# Patient Record
Sex: Female | Born: 2000 | Race: White | Hispanic: No | Marital: Single | Smoking: Current some day smoker
Health system: Southern US, Community
[De-identification: ages and names within clinical notes are randomized; demographics above are authoritative.]

## PROBLEM LIST (undated history)

## (undated) DIAGNOSIS — F32A Depression, unspecified: Secondary | ICD-10-CM

## (undated) DIAGNOSIS — F419 Anxiety disorder, unspecified: Secondary | ICD-10-CM

## (undated) DIAGNOSIS — O24419 Gestational diabetes mellitus in pregnancy, unspecified control: Secondary | ICD-10-CM

## (undated) DIAGNOSIS — F319 Bipolar disorder, unspecified: Secondary | ICD-10-CM

## (undated) DIAGNOSIS — G8929 Other chronic pain: Secondary | ICD-10-CM

## (undated) DIAGNOSIS — M549 Dorsalgia, unspecified: Secondary | ICD-10-CM

## (undated) HISTORY — PX: TYMPANOSTOMY: SHX2586

## (undated) HISTORY — PX: WISDOM TOOTH EXTRACTION: SHX21

## (undated) HISTORY — PX: ADENOIDECTOMY: SUR15

---

## 2018-02-17 ENCOUNTER — Other Ambulatory Visit: Payer: Self-pay

## 2018-02-17 ENCOUNTER — Ambulatory Visit (HOSPITAL_COMMUNITY)
Admission: EM | Admit: 2018-02-17 | Discharge: 2018-02-17 | Disposition: A | Discharge status: Home / Self Care | Payer: BLUE CROSS/BLUE SHIELD | Attending: Urgent Care | Admitting: Urgent Care

## 2018-02-17 ENCOUNTER — Encounter (HOSPITAL_COMMUNITY): Payer: Self-pay | Admitting: Emergency Medicine

## 2018-02-17 DIAGNOSIS — R102 Pelvic and perineal pain: Secondary | ICD-10-CM | POA: Insufficient documentation

## 2018-02-17 DIAGNOSIS — F17210 Nicotine dependence, cigarettes, uncomplicated: Secondary | ICD-10-CM | POA: Insufficient documentation

## 2018-02-17 DIAGNOSIS — E860 Dehydration: Secondary | ICD-10-CM | POA: Insufficient documentation

## 2018-02-17 DIAGNOSIS — K5900 Constipation, unspecified: Secondary | ICD-10-CM | POA: Diagnosis not present

## 2018-02-17 DIAGNOSIS — R1030 Lower abdominal pain, unspecified: Secondary | ICD-10-CM

## 2018-02-17 LAB — POCT URINALYSIS DIP (DEVICE)
Bilirubin Urine: NEGATIVE
GLUCOSE, UA: NEGATIVE mg/dL
Hgb urine dipstick: NEGATIVE
Ketones, ur: NEGATIVE mg/dL
Leukocytes, UA: NEGATIVE
Nitrite: NEGATIVE
PROTEIN: NEGATIVE mg/dL
SPECIFIC GRAVITY, URINE: 1.025 (ref 1.005–1.030)
UROBILINOGEN UA: 0.2 mg/dL (ref 0.0–1.0)
pH: 6 (ref 5.0–8.0)

## 2018-02-17 NOTE — ED Triage Notes (Signed)
Abdominal pain for 2 weeks, intermittent.  Pain has been significant today.  Patient has had nausea.  Last bm was this morning, patient thinks she is constipated.  Patient has headaches and general aches, shaky.

## 2018-02-17 NOTE — ED Provider Notes (Signed)
  MRN: [id number] DOB: [birth year]  Subjective:   Isabel Chang is a 17 y.o. female presenting for 2 week history of lower abdominal/pelvic pain, worse today. Has had nausea without vomiting, urinary frequency (urinating 8-10), aching, general headaches. Has difficulty with constipation, strains, has painful defecation. Has small, hard stools. Denies fever, dysuria, hematuria. Does not hydrate with water. Drinks sodas and practices very unhealthy diet. She is currently smoking cigarettes.  Isabel Chang is not currently taking any medications and has No Known Allergies.  Isabel Chang denies past medical and surgical history.   Objective:   Vitals: BP 108/74 (BP Location: Left Arm)   Pulse 98   Temp 98.1 F (36.7 C) (Oral)   Resp 18   LMP 01/02/2018   SpO2 98%   Physical Exam  Constitutional: She is oriented to person, place, and time. She appears well-developed and well-nourished.  HENT:  Mouth/Throat: Oropharynx is clear and moist.  Eyes: No scleral icterus.  Cardiovascular: Normal rate, regular rhythm and intact distal pulses. Exam reveals no gallop and no friction rub.  No murmur heard. Pulmonary/Chest: No respiratory distress. She has no wheezes. She has no rales.  Abdominal: Soft. Bowel sounds are normal. She exhibits no distension and no mass. There is no tenderness. There is no guarding.  Musculoskeletal: She exhibits no edema.  Neurological: She is alert and oriented to person, place, and time.  Skin: Skin is warm and dry. No rash noted. No erythema. No pallor.  Psychiatric: She has a normal mood and affect.   Results for orders placed or performed during the hospital encounter of 02/17/18 (from the past 24 hour(s))  POCT urinalysis dip (device)     Status: None   Collection Time: 02/17/18  8:08 PM  Result Value Ref Range   Glucose, UA NEGATIVE NEGATIVE mg/dL   Bilirubin Urine NEGATIVE NEGATIVE   Ketones, ur NEGATIVE NEGATIVE mg/dL   Specific Gravity, Urine 1.025 1.005 - 1.030   Hgb  urine dipstick NEGATIVE NEGATIVE   pH 6.0 5.0 - 8.0   Protein, ur NEGATIVE NEGATIVE mg/dL   Urobilinogen, UA 0.2 0.0 - 1.0 mg/dL   Nitrite NEGATIVE NEGATIVE   Leukocytes, UA NEGATIVE NEGATIVE   Assessment and Plan :   Pelvic pain in female  Lower abdominal pain  Constipation, unspecified constipation type  Dehydration  Counseled on differential. I suspect her symptoms are largely due unhealthy lifestyle, recommended lifestyle modifications. Return-to-clinic precautions discussed, patient verbalized understanding.    Wallis BambergMani, Jammie Troup, PA-C 02/18/18 1600

## 2018-02-19 LAB — URINE CULTURE

## 2018-09-04 ENCOUNTER — Encounter: Payer: Self-pay | Admitting: Emergency Medicine

## 2018-09-04 ENCOUNTER — Emergency Department
Admission: EM | Admit: 2018-09-04 | Discharge: 2018-09-04 | Disposition: A | Discharge status: Home / Self Care | Payer: BLUE CROSS/BLUE SHIELD | Source: Home / Self Care | Attending: Family Medicine | Admitting: Family Medicine

## 2018-09-04 ENCOUNTER — Emergency Department (INDEPENDENT_AMBULATORY_CARE_PROVIDER_SITE_OTHER): Payer: BLUE CROSS/BLUE SHIELD

## 2018-09-04 DIAGNOSIS — M545 Low back pain, unspecified: Secondary | ICD-10-CM

## 2018-09-04 DIAGNOSIS — G8929 Other chronic pain: Secondary | ICD-10-CM | POA: Diagnosis not present

## 2018-09-04 DIAGNOSIS — M546 Pain in thoracic spine: Secondary | ICD-10-CM

## 2018-09-04 MED ORDER — CYCLOBENZAPRINE HCL 10 MG PO TABS: 10.0000 mg | ORAL_TABLET | Freq: Every day | ORAL | 1 refills | Status: DC

## 2018-09-04 MED ORDER — PREDNISONE 20 MG PO TABS: ORAL_TABLET | ORAL | 0 refills | Status: DC
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
## 2018-12-02 ENCOUNTER — Encounter: Payer: Self-pay | Admitting: *Deleted

## 2018-12-02 ENCOUNTER — Other Ambulatory Visit: Payer: Self-pay

## 2018-12-02 ENCOUNTER — Emergency Department
Admission: EM | Admit: 2018-12-02 | Discharge: 2018-12-02 | Disposition: A | Discharge status: Home / Self Care | Payer: BLUE CROSS/BLUE SHIELD | Source: Home / Self Care | Attending: Family Medicine | Admitting: Family Medicine

## 2018-12-02 DIAGNOSIS — R110 Nausea: Secondary | ICD-10-CM

## 2018-12-02 DIAGNOSIS — R5383 Other fatigue: Secondary | ICD-10-CM | POA: Diagnosis not present

## 2018-12-02 DIAGNOSIS — M25561 Pain in right knee: Secondary | ICD-10-CM

## 2018-12-02 DIAGNOSIS — M25562 Pain in left knee: Secondary | ICD-10-CM

## 2018-12-02 DIAGNOSIS — M25522 Pain in left elbow: Secondary | ICD-10-CM

## 2018-12-02 DIAGNOSIS — M25521 Pain in right elbow: Secondary | ICD-10-CM

## 2018-12-02 HISTORY — DX: Dorsalgia, unspecified: M54.9

## 2018-12-02 HISTORY — DX: Bipolar disorder, unspecified: F31.9

## 2018-12-02 HISTORY — DX: Other chronic pain: G89.29

## 2018-12-02 LAB — POCT URINALYSIS DIP (MANUAL ENTRY)
BILIRUBIN UA: NEGATIVE mg/dL
Blood, UA: NEGATIVE
GLUCOSE UA: NEGATIVE mg/dL
Leukocytes, UA: NEGATIVE
Nitrite, UA: NEGATIVE
Protein Ur, POC: 30 mg/dL — AB
SPEC GRAV UA: 1.02 (ref 1.010–1.025)
Urobilinogen, UA: 1 E.U./dL
pH, UA: 6.5 (ref 5.0–8.0)

## 2018-12-02 LAB — POCT CBC W AUTO DIFF (K'VILLE URGENT CARE)

## 2018-12-02 LAB — POCT URINE PREGNANCY: PREG TEST UR: NEGATIVE

## 2018-12-02 NOTE — ED Triage Notes (Signed)
Pt c/o N/V, decreased appetite and fluctuating weights x 3 wks.
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
## 2018-12-03 ENCOUNTER — Telehealth: Payer: Self-pay | Admitting: *Deleted

## 2018-12-03 LAB — COMPLETE METABOLIC PANEL WITH GFR
AG Ratio: 1.8 (calc) (ref 1.0–2.5)
ALT: 12 U/L (ref 5–32)
AST: 16 U/L (ref 12–32)
Albumin: 4.8 g/dL (ref 3.6–5.1)
Alkaline phosphatase (APISO): 52 U/L (ref 47–176)
BUN: 9 mg/dL (ref 7–20)
CALCIUM: 10.3 mg/dL (ref 8.9–10.4)
CO2: 25 mmol/L (ref 20–32)
Chloride: 102 mmol/L (ref 98–110)
Creat: 0.82 mg/dL (ref 0.50–1.00)
GFR, Est African American: 120 mL/min/{1.73_m2} (ref >=60)
GFR, Est Non African American: 104 mL/min/{1.73_m2} (ref >=60)
GLOBULIN: 2.7 g/dL (ref 2.0–3.8)
Glucose, Bld: 76 mg/dL (ref 65–99)
Potassium: 4.6 mmol/L (ref 3.8–5.1)
SODIUM: 139 mmol/L (ref 135–146)
Total Bilirubin: 0.7 mg/dL (ref 0.2–1.1)
Total Protein: 7.5 g/dL (ref 6.3–8.2)

## 2018-12-03 LAB — TSH: TSH: 1.33 mIU/L

## 2018-12-03 LAB — SEDIMENTATION RATE: SED RATE: 11 mm/h (ref 0–20)

## 2018-12-03 NOTE — Telephone Encounter (Signed)
Contacted pt, states she feels better this am, advised that her lab work all came back normal and to return as needed.

## 2019-12-19 NOTE — L&D Delivery Note (Signed)
Operative Delivery Note At 10:51 AM a viable female was delivered via Vaginal, Spontaneous.  Presentation: vertex; Position: Right,, Occiput,, Anterior; Station: +3.  With pushing late decels noted with FHR into the 90 with recovery to baseline.  Discussed plan for vacuum due to non-reassuring fetal heart tones to help expedite delivery.   Verbal consent: obtained from patient.  Risks and benefits discussed in detail.  Risks include, but are not limited to the risks of anesthesia, bleeding, infection, damage to maternal tissues, fetal cephalhematoma.  There is also the risk of inability to effect vaginal delivery of the head, or shoulder dystocia that cannot be resolved by established maneuvers, leading to the need for emergency cesarean section.  Foley and IUPC removed with pushing.  Bell applied- one pull, no pop offs with delivery  APGAR:8 , 9; weight pending .   Placenta status: L&D.   Cord:  with the following complications: none.  Cord pH: n/a  Anesthesia:  epidural Instruments: Bell Episiotomy: None Lacerations: Labial Suture Repair: 3.0 vicryl Est. Blood Loss (mL): 355  Mom to postpartum.  Baby to Couplet care / Skin to Skin.  [name] [name] 08/23/2020, 11:12 AM

## 2020-01-26 LAB — OB RESULTS CONSOLE RPR: RPR: NONREACTIVE

## 2020-01-26 LAB — OB RESULTS CONSOLE HEPATITIS B SURFACE ANTIGEN: Hepatitis B Surface Ag: NEGATIVE

## 2020-01-26 LAB — OB RESULTS CONSOLE HIV ANTIBODY (ROUTINE TESTING): HIV: NONREACTIVE

## 2020-01-26 LAB — OB RESULTS CONSOLE RUBELLA ANTIBODY, IGM: Rubella: IMMUNE

## 2020-02-12 LAB — OB RESULTS CONSOLE GC/CHLAMYDIA
Chlamydia: NEGATIVE
Gonorrhea: NEGATIVE

## 2020-04-14 ENCOUNTER — Other Ambulatory Visit (HOSPITAL_COMMUNITY): Payer: Self-pay | Admitting: Obstetrics & Gynecology

## 2020-04-14 DIAGNOSIS — Z363 Encounter for antenatal screening for malformations: Secondary | ICD-10-CM

## 2020-05-04 ENCOUNTER — Ambulatory Visit (HOSPITAL_COMMUNITY): Payer: BC Managed Care – PPO | Attending: Obstetrics and Gynecology

## 2020-05-04 ENCOUNTER — Other Ambulatory Visit: Payer: Self-pay

## 2020-05-04 ENCOUNTER — Ambulatory Visit: Payer: BC Managed Care – PPO | Admitting: *Deleted

## 2020-05-04 VITALS — BP 117/72 | HR 96

## 2020-05-04 DIAGNOSIS — Z3A24 24 weeks gestation of pregnancy: Secondary | ICD-10-CM

## 2020-05-04 DIAGNOSIS — Z363 Encounter for antenatal screening for malformations: Secondary | ICD-10-CM | POA: Insufficient documentation

## 2020-05-04 DIAGNOSIS — O0990 Supervision of high risk pregnancy, unspecified, unspecified trimester: Secondary | ICD-10-CM | POA: Diagnosis present

## 2020-05-04 DIAGNOSIS — O99332 Smoking (tobacco) complicating pregnancy, second trimester: Secondary | ICD-10-CM

## 2020-05-04 DIAGNOSIS — E669 Obesity, unspecified: Secondary | ICD-10-CM | POA: Diagnosis not present

## 2020-06-16 ENCOUNTER — Encounter: Payer: BC Managed Care – PPO | Attending: Obstetrics & Gynecology | Admitting: Registered"

## 2020-06-16 ENCOUNTER — Encounter (HOSPITAL_COMMUNITY): Payer: Self-pay | Admitting: Obstetrics and Gynecology

## 2020-06-16 ENCOUNTER — Inpatient Hospital Stay (HOSPITAL_COMMUNITY)
Admission: AD | Admit: 2020-06-16 | Discharge: 2020-06-16 | Disposition: A | Discharge status: Home / Self Care | Payer: BC Managed Care – PPO | Attending: Obstetrics & Gynecology | Admitting: Obstetrics & Gynecology

## 2020-06-16 ENCOUNTER — Other Ambulatory Visit: Payer: Self-pay

## 2020-06-16 DIAGNOSIS — R109 Unspecified abdominal pain: Secondary | ICD-10-CM | POA: Insufficient documentation

## 2020-06-16 DIAGNOSIS — O26899 Other specified pregnancy related conditions, unspecified trimester: Secondary | ICD-10-CM

## 2020-06-16 DIAGNOSIS — Z79899 Other long term (current) drug therapy: Secondary | ICD-10-CM | POA: Diagnosis not present

## 2020-06-16 DIAGNOSIS — F319 Bipolar disorder, unspecified: Secondary | ICD-10-CM | POA: Diagnosis not present

## 2020-06-16 DIAGNOSIS — K5900 Constipation, unspecified: Secondary | ICD-10-CM | POA: Diagnosis not present

## 2020-06-16 DIAGNOSIS — Z3A30 30 weeks gestation of pregnancy: Secondary | ICD-10-CM | POA: Insufficient documentation

## 2020-06-16 DIAGNOSIS — O99613 Diseases of the digestive system complicating pregnancy, third trimester: Secondary | ICD-10-CM | POA: Insufficient documentation

## 2020-06-16 DIAGNOSIS — O99333 Smoking (tobacco) complicating pregnancy, third trimester: Secondary | ICD-10-CM | POA: Diagnosis not present

## 2020-06-16 DIAGNOSIS — F17210 Nicotine dependence, cigarettes, uncomplicated: Secondary | ICD-10-CM | POA: Insufficient documentation

## 2020-06-16 DIAGNOSIS — O99343 Other mental disorders complicating pregnancy, third trimester: Secondary | ICD-10-CM | POA: Insufficient documentation

## 2020-06-16 DIAGNOSIS — O26893 Other specified pregnancy related conditions, third trimester: Secondary | ICD-10-CM | POA: Diagnosis not present

## 2020-06-16 HISTORY — DX: Anxiety disorder, unspecified: F41.9

## 2020-06-16 HISTORY — DX: Gestational diabetes mellitus in pregnancy, unspecified control: O24.419

## 2020-06-16 HISTORY — DX: Depression, unspecified: F32.A

## 2020-06-16 LAB — URINALYSIS, ROUTINE W REFLEX MICROSCOPIC
Bilirubin Urine: NEGATIVE
Glucose, UA: NEGATIVE mg/dL
Hgb urine dipstick: NEGATIVE
Ketones, ur: NEGATIVE mg/dL
Leukocytes,Ua: NEGATIVE
Nitrite: NEGATIVE
Protein, ur: NEGATIVE mg/dL
Specific Gravity, Urine: 1.013 (ref 1.005–1.030)
pH: 8 (ref 5.0–8.0)

## 2020-06-16 LAB — WET PREP, GENITAL
Clue Cells Wet Prep HPF POC: NONE SEEN
Sperm: NONE SEEN
Trich, Wet Prep: NONE SEEN
WBC, Wet Prep HPF POC: NONE SEEN
Yeast Wet Prep HPF POC: NONE SEEN

## 2020-06-16 NOTE — Discharge Instructions (Signed)
Fetal Movement Counts Patient Name: ________________________________________________ Patient Due Date: ____________________ What is a fetal movement count?  A fetal movement count is the number of times that you feel your baby move during a certain amount of time. This may also be called a fetal kick count. A fetal movement count is recommended for every pregnant woman. You may be asked to start counting fetal movements as early as week 28 of your pregnancy. Pay attention to when your baby is most active. You may notice your baby's sleep and wake cycles. You may also notice things that make your baby move more. You should do a fetal movement count:  When your baby is normally most active.  At the same time each day. A good time to count movements is while you are resting, after having something to eat and drink. How do I count fetal movements? 1. Find a quiet, comfortable area. Sit, or lie down on your side. 2. Write down the date, the start time and stop time, and the number of movements that you felt between those two times. Take this information with you to your health care visits. 3. Write down your start time when you feel the first movement. 4. Count kicks, flutters, swishes, rolls, and jabs. You should feel at least 10 movements. 5. You may stop counting after you have felt 10 movements, or if you have been counting for 2 hours. Write down the stop time. 6. If you do not feel 10 movements in 2 hours, contact your health care provider for further instructions. Your health care provider may want to do additional tests to assess your baby's well-being. Contact a health care provider if:  You feel fewer than 10 movements in 2 hours.  Your baby is not moving like he or she usually does. Date: ____________ Start time: ____________ Stop time: ____________ Movements: ____________ Date: ____________ Start time: ____________ Stop time: ____________ Movements: ____________ Date: ____________  Start time: ____________ Stop time: ____________ Movements: ____________ Date: ____________ Start time: ____________ Stop time: ____________ Movements: ____________ Date: ____________ Start time: ____________ Stop time: ____________ Movements: ____________ Date: ____________ Start time: ____________ Stop time: ____________ Movements: ____________ Date: ____________ Start time: ____________ Stop time: ____________ Movements: ____________ Date: ____________ Start time: ____________ Stop time: ____________ Movements: ____________ Date: ____________ Start time: ____________ Stop time: ____________ Movements: ____________ This information is not intended to replace advice given to you by your health care provider. Make sure you discuss any questions you have with your health care provider. Document Revised: 07/24/2019 Document Reviewed: 07/24/2019 Elsevier Patient Education  2020 [employer]. Constipation, Adult Constipation is when a person has fewer bowel movements in a week than normal, has difficulty having a bowel movement, or has stools that are dry, hard, or larger than normal. Constipation may be caused by an underlying condition. It may become worse with age if a person takes certain medicines and does not take in enough fluids. Follow these instructions at home: Eating and drinking   Eat foods that have a lot of fiber, such as fresh fruits and vegetables, whole grains, and beans.  Limit foods that are high in fat, low in fiber, or overly processed, such as french fries, hamburgers, cookies, candies, and soda.  Drink enough fluid to keep your urine clear or pale yellow. General instructions  Exercise regularly or as told by your health care provider.  Go to the restroom when you have the urge to go. Do not hold it in.  Take over-the-counter and prescription  medicines only as told by your health care provider. These include any fiber supplements.  Practice pelvic floor retraining  exercises, such as deep breathing while relaxing the lower abdomen and pelvic floor relaxation during bowel movements.  Watch your condition for any changes.  Keep all follow-up visits as told by your health care provider. This is important. Contact a health care provider if:  You have pain that gets worse.  You have a fever.  You do not have a bowel movement after 4 days.  You vomit.  You are not hungry.  You lose weight.  You are bleeding from the anus.  You have thin, pencil-like stools. Get help right away if:  You have a fever and your symptoms suddenly get worse.  You leak stool or have blood in your stool.  Your abdomen is bloated.  You have severe pain in your abdomen.  You feel dizzy or you faint. This information is not intended to replace advice given to you by your health care provider. Make sure you discuss any questions you have with your health care provider. Document Revised: 11/16/2017 Document Reviewed: 05/24/2016 Elsevier Patient Education  2020 [employer].

## 2020-06-16 NOTE — MAU Provider Note (Signed)
Chief Complaint:  Abdominal Pain   First Provider Initiated Contact with Patient 06/16/20 1608     HPI: Isabel Chang is a 19 y.o. G1P0 at 30w2dwho presents to maternity admissions reporting abdominal cramping. Symptoms started this morning. States they are consistent and have worsened through the day. Has not noticed an increase in yellow discharge since yesterday that is causing some vaginal irritation. Has been constipated; last bowel movement was 2 days ago, has not treated constipation. Endorses nausea, has not treated symptoms, thinks it is due to not eating recently. She has not drank much water today. No vomiting. Denies fever, dysuria, vaginal bleeding, or loss of fluid. No recent intercourse. Good fetal movement.  Location: abdomen Quality: cramping Severity: 4/10 in pain scale Duration: 1 day Timing: intermittent Modifying factors: none Associated signs and symptoms: nausea, vaginal discharge  Pregnancy Course: [facility] Denies complications with this pregnancy  Past Medical History:  Diagnosis Date  . Anxiety   . Bipolar disorder ([place]   . Chronic back pain   . Depression   . Gestational diabetes    OB History  Gravida Para Term Preterm AB Living  1            SAB TAB Ectopic Multiple Live Births               # Outcome Date GA Lbr Len/2nd Weight Sex Delivery Anes PTL Lv  1 Current            Past Surgical History:  Procedure Laterality Date  . ADENOIDECTOMY    . TYMPANOSTOMY    . WISDOM TOOTH EXTRACTION     Family History  Problem Relation Age of Onset  . Diabetes Mother    Social History   Tobacco Use  . Smoking status: Current Some Day Smoker    Packs/day: 1.00    Types: Cigarettes  . Smokeless tobacco: Never Used  Vaping Use  . Vaping Use: Every day  Substance Use Topics  . Alcohol use: Not Currently  . Drug use: Yes    Types: Marijuana    Comment: May was last usage   No Known Allergies Medications Prior to Admission   Medication Sig Dispense Refill Last Dose  . Blood Glucose Monitoring Suppl (ACCU-CHEK GUIDE) w/Device KIT AS DIRECTED FINGER STICK     . CONTOUR NEXT TEST test strip as directed.     . ergocalciferol (VITAMIN D2) 1.25 MG (50000 UT) capsule ergocalciferol (vitamin D2) 1,250 mcg (50,000 unit) capsule  TK 1 C PO Q WK IN THE MORNING     . fluticasone (FLONASE) 50 MCG/ACT nasal spray fluticasone propionate 50 mcg/actuation nasal spray,suspension  SHAKE LQ AND U 1 SPR NASALLY QD IN THE MORNING     . lamoTRIgine (LAMICTAL) 25 MG tablet      . loratadine (CLARITIN) 10 MG tablet loratadine 10 mg tablet  TK 1 T PO QD IN THE MORNING     . Microlet Lancets MISC 4 (four) times daily.     . ondansetron (ZOFRAN-ODT) 4 MG disintegrating tablet ondansetron 4 mg disintegrating tablet     . Prenatal Vit-Fe Fumarate-FA (PRENATAL VITAMINS PO) Take by mouth.       I have reviewed patient's Past Medical Hx, Surgical Hx, Family Hx, Social Hx, medications and allergies.   ROS:  Review of Systems  Constitutional: Negative.   Gastrointestinal: Positive for abdominal pain, constipation and nausea. Negative for diarrhea and vomiting.  Genitourinary: Positive for vaginal discharge.  Negative for dysuria and vaginal bleeding.    Physical Exam   Patient Vitals for the past 24 hrs:  BP Temp Temp src Pulse Resp SpO2 Height Weight  06/16/20 1548 129/67 98.4 F (36.9 C) Oral 91 18 100 % 5' 2"  (1.575 m) 81.2 kg    Constitutional: Well-developed, well-nourished female in no acute distress.  Cardiovascular: normal rate & rhythm, no murmur Respiratory: normal effort, lung sounds clear throughout GI: Abd soft, non-tender, gravid appropriate for gestational age. Pos BS x 4 MS: Extremities nontender, no edema, normal ROM Neurologic: Alert and oriented x 4.  GU:      Pelvic: NEFG, physiologic discharge, no blood, cervix clean.   Dilation: Closed Effacement (%): Thick Cervical Position: Posterior Exam by:: Jorje Guild NP  Fetal Tracing:  Baseline: 145 Variability: moderate Accelerations: 15x15 Decelerations: none  Toco: none    Labs: Results for orders placed or performed during the hospital encounter of 06/16/20 (from the past 24 hour(s))  Urinalysis, Routine w reflex microscopic     Status: Abnormal   Collection Time: 06/16/20  3:34 PM  Result Value Ref Range   Color, Urine YELLOW YELLOW   APPearance HAZY (A) CLEAR   Specific Gravity, Urine 1.013 1.005 - 1.030   pH 8.0 5.0 - 8.0   Glucose, UA NEGATIVE NEGATIVE mg/dL   Hgb urine dipstick NEGATIVE NEGATIVE   Bilirubin Urine NEGATIVE NEGATIVE   Ketones, ur NEGATIVE NEGATIVE mg/dL   Protein, ur NEGATIVE NEGATIVE mg/dL   Nitrite NEGATIVE NEGATIVE   Leukocytes,Ua NEGATIVE NEGATIVE  Wet prep, genital     Status: None   Collection Time: 06/16/20  4:44 PM  Result Value Ref Range   Yeast Wet Prep HPF POC NONE SEEN NONE SEEN   Trich, Wet Prep NONE SEEN NONE SEEN   Clue Cells Wet Prep HPF POC NONE SEEN NONE SEEN   WBC, Wet Prep HPF POC NONE SEEN NONE SEEN   Sperm NONE SEEN     Imaging:  No results found.  MAU Course: Orders Placed This Encounter  Procedures  . Wet prep, genital  . Urinalysis, Routine w reflex microscopic  . Discharge patient   No orders of the defined types were placed in this encounter.   MDM: Reactive nst. No contractions on monitor. Abdomen soft & non tender. Cervix closed/thick Negative u/a Negative wet prep  Assessment: 1. Abdominal cramping affecting pregnancy   2. [redacted] weeks gestation of pregnancy     Plan: Discharge home in stable condition.  GC/CT pending Discussed reasons to return to MAU Keep appointment with Dr. Nelda Marseille tomorrow     Follow-up Information    Gynecology, [facility].   Specialty: Obstetrics and Gynecology Why: keep appointment tomorrow Contact information: [address] [place] [address] [phone]               Allergies  as of 06/16/2020   No Known Allergies     Medication List    TAKE these medications   Accu-Chek Guide w/Device Kit AS DIRECTED FINGER STICK   Contour Next Test test strip Generic drug: glucose blood as directed.   ergocalciferol 1.25 MG (50000 UT) capsule Commonly known as: VITAMIN D2 ergocalciferol (vitamin D2) 1,250 mcg (50,000 unit) capsule  TK 1 C PO Q WK IN THE MORNING   fluticasone 50 MCG/ACT nasal spray Commonly known as: FLONASE fluticasone propionate 50 mcg/actuation nasal spray,suspension  SHAKE LQ AND U 1 SPR NASALLY QD IN THE MORNING  lamoTRIgine 25 MG tablet Commonly known as: LAMICTAL   loratadine 10 MG tablet Commonly known as: CLARITIN loratadine 10 mg tablet  TK 1 T PO QD IN THE MORNING   Microlet Lancets Misc 4 (four) times daily.   ondansetron 4 MG disintegrating tablet Commonly known as: ZOFRAN-ODT ondansetron 4 mg disintegrating tablet   PRENATAL VITAMINS PO Take by mouth.       Jorje Guild, NP 06/16/2020 5:11 PM

## 2020-06-16 NOTE — MAU Note (Signed)
Patient c/o cramping since this morning at 1030. No bleeding. Fetal movement per patient. Nausea and not feeling "well".

## 2020-06-17 LAB — GC/CHLAMYDIA PROBE AMP (~~LOC~~) NOT AT ARMC
Chlamydia: NEGATIVE
Comment: NEGATIVE
Comment: NORMAL
Neisseria Gonorrhea: NEGATIVE

## 2020-07-08 ENCOUNTER — Other Ambulatory Visit: Payer: Self-pay

## 2020-07-08 ENCOUNTER — Encounter (HOSPITAL_COMMUNITY): Payer: Self-pay | Admitting: Obstetrics & Gynecology

## 2020-07-08 ENCOUNTER — Inpatient Hospital Stay (HOSPITAL_COMMUNITY)
Admission: AD | Admit: 2020-07-08 | Discharge: 2020-07-08 | Disposition: A | Discharge status: Home / Self Care | Payer: BC Managed Care – PPO | Attending: Obstetrics & Gynecology | Admitting: Obstetrics & Gynecology

## 2020-07-08 DIAGNOSIS — N859 Noninflammatory disorder of uterus, unspecified: Secondary | ICD-10-CM | POA: Diagnosis not present

## 2020-07-08 DIAGNOSIS — F1290 Cannabis use, unspecified, uncomplicated: Secondary | ICD-10-CM | POA: Diagnosis not present

## 2020-07-08 DIAGNOSIS — N858 Other specified noninflammatory disorders of uterus: Secondary | ICD-10-CM

## 2020-07-08 DIAGNOSIS — O99333 Smoking (tobacco) complicating pregnancy, third trimester: Secondary | ICD-10-CM | POA: Diagnosis not present

## 2020-07-08 DIAGNOSIS — F17210 Nicotine dependence, cigarettes, uncomplicated: Secondary | ICD-10-CM | POA: Diagnosis not present

## 2020-07-08 DIAGNOSIS — M545 Low back pain: Secondary | ICD-10-CM

## 2020-07-08 DIAGNOSIS — Z3A33 33 weeks gestation of pregnancy: Secondary | ICD-10-CM | POA: Diagnosis not present

## 2020-07-08 DIAGNOSIS — R109 Unspecified abdominal pain: Secondary | ICD-10-CM

## 2020-07-08 DIAGNOSIS — O4700 False labor before 37 completed weeks of gestation, unspecified trimester: Secondary | ICD-10-CM

## 2020-07-08 DIAGNOSIS — O26893 Other specified pregnancy related conditions, third trimester: Secondary | ICD-10-CM | POA: Diagnosis present

## 2020-07-08 DIAGNOSIS — O99323 Drug use complicating pregnancy, third trimester: Secondary | ICD-10-CM | POA: Diagnosis not present

## 2020-07-08 DIAGNOSIS — O4703 False labor before 37 completed weeks of gestation, third trimester: Secondary | ICD-10-CM | POA: Diagnosis not present

## 2020-07-08 DIAGNOSIS — Z79899 Other long term (current) drug therapy: Secondary | ICD-10-CM | POA: Insufficient documentation

## 2020-07-08 LAB — URINALYSIS, ROUTINE W REFLEX MICROSCOPIC
Bilirubin Urine: NEGATIVE
Glucose, UA: NEGATIVE mg/dL
Hgb urine dipstick: NEGATIVE
Ketones, ur: NEGATIVE mg/dL
Leukocytes,Ua: NEGATIVE
Nitrite: NEGATIVE
Protein, ur: NEGATIVE mg/dL
Specific Gravity, Urine: 1.009 (ref 1.005–1.030)
pH: 7 (ref 5.0–8.0)

## 2020-07-08 LAB — FETAL FIBRONECTIN: Fetal Fibronectin: NEGATIVE

## 2020-07-08 MED ORDER — NIFEDIPINE 10 MG PO CAPS
10.0000 mg | ORAL_CAPSULE | ORAL | Status: AC | PRN
  Administered 2020-07-08 (×4): 10 mg via ORAL
  Filled 2020-07-08 (×4): qty 1

## 2020-07-08 NOTE — Progress Notes (Signed)
Pt up to BR

## 2020-07-08 NOTE — MAU Note (Signed)
Have cramping and lower back pain for 24hrs. Denies LOF or VB

## 2020-07-08 NOTE — MAU Provider Note (Addendum)
Chief Complaint:  Abdominal Pain, Contractions, and Back Pain   First Provider Initiated Contact with Patient 07/08/20 2008     HPI: Isabel Chang is a 19 y.o. G1P0 at 61w3dho presents to maternity admissions reporting generalized abdominal cramping and low back pain for the past day or so.  No history of PTL . She reports good fetal movement, denies LOF, vaginal bleeding, vaginal itching/burning, urinary symptoms, h/a, dizziness, n/v, diarrhea, constipation or fever/chills.    Abdominal Pain This is a new problem. The current episode started yesterday. The onset quality is gradual. The problem occurs intermittently. The problem has been unchanged. The pain is located in the generalized abdominal region. The quality of the pain is cramping. The abdominal pain radiates to the back. Pertinent negatives include no constipation, diarrhea, dysuria, fever, frequency, nausea or vomiting. Nothing aggravates the pain. The pain is relieved by nothing. She has tried nothing for the symptoms.  Back Pain This is a new problem. The current episode started yesterday. The problem occurs intermittently. The problem is unchanged. The pain is present in the lumbar spine. The quality of the pain is described as cramping. The pain does not radiate. Associated symptoms include abdominal pain. Pertinent negatives include no dysuria, fever or paresis. She has tried nothing for the symptoms.    RN Note: Have cramping and lower back pain for 24hrs. Denies LOF or VB  Past Medical History: Past Medical History:  Diagnosis Date  . Anxiety   . Bipolar disorder ([place]   . Chronic back pain   . Depression   . Gestational diabetes     Past obstetric history: OB History  Gravida Para Term Preterm AB Living  1            SAB TAB Ectopic Multiple Live Births               # Outcome Date GA Lbr Len/2nd Weight Sex Delivery Anes PTL Lv  1 Current             Past Surgical History: Past Surgical History:  Procedure  Laterality Date  . ADENOIDECTOMY    . TYMPANOSTOMY    . WISDOM TOOTH EXTRACTION      Family History: Family History  Problem Relation Age of Onset  . Diabetes Mother     Social History: Social History   Tobacco Use  . Smoking status: Current Some Day Smoker    Packs/day: 1.00    Types: Cigarettes  . Smokeless tobacco: Never Used  Vaping Use  . Vaping Use: Every day  Substance Use Topics  . Alcohol use: Not Currently  . Drug use: Yes    Types: Marijuana    Comment: May was last usage    Allergies: No Known Allergies  Meds:  Medications Prior to Admission  Medication Sig Dispense Refill Last Dose  . Blood Glucose Monitoring Suppl (ACCU-CHEK GUIDE) w/Device KIT AS DIRECTED FINGER STICK     . CONTOUR NEXT TEST test strip as directed.     . ergocalciferol (VITAMIN D2) 1.25 MG (50000 UT) capsule ergocalciferol (vitamin D2) 1,250 mcg (50,000 unit) capsule  TK 1 C PO Q WK IN THE MORNING     . fluticasone (FLONASE) 50 MCG/ACT nasal spray fluticasone propionate 50 mcg/actuation nasal spray,suspension  SHAKE LQ AND U 1 SPR NASALLY QD IN THE MORNING     . lamoTRIgine (LAMICTAL) 25 MG tablet      . loratadine (CLARITIN) 10 MG tablet loratadine 10 mg tablet  TK  1 T PO QD IN THE MORNING     . Microlet Lancets MISC 4 (four) times daily.     . ondansetron (ZOFRAN-ODT) 4 MG disintegrating tablet ondansetron 4 mg disintegrating tablet     . Prenatal Vit-Fe Fumarate-FA (PRENATAL VITAMINS PO) Take by mouth.       I have reviewed patient's Past Medical Hx, Surgical Hx, Family Hx, Social Hx, medications and allergies.   ROS:  Review of Systems  Constitutional: Negative for fever.  Gastrointestinal: Positive for abdominal pain. Negative for constipation, diarrhea, nausea and vomiting.  Genitourinary: Negative for dysuria and frequency.  Musculoskeletal: Positive for back pain.   Other systems negative  Physical Exam   Patient Vitals for the past 24 hrs:  BP Temp Pulse Resp  Height Weight  07/08/20 1948 (!) 127/52 -- 90 -- -- --  07/08/20 1947 -- 98.6 F (37 C) -- 18 5' 2"  (1.575 m) 83.5 kg   Constitutional: Well-developed, well-nourished female in no acute distress.  Cardiovascular: normal rate and rhythm Respiratory: normal effort, clear to auscultation bilaterally GI: Abd soft, non-tender, gravid appropriate for gestational age.   No rebound or guarding. MS: Extremities nontender, no edema, normal ROM Neurologic: Alert and oriented x 4.  GU: Neg CVAT.  PELVIC EXAM:   Dilation: Closed Effacement (%): 50 Station: Ballotable Exam by:: Hansel Feinstein CNM  FHT:  Baseline 140 , moderate variability, accelerations present, no decelerations Contractions: Uterine irritability with Irregular contractions   Labs: Results for orders placed or performed during the hospital encounter of 07/08/20 (from the past 24 hour(s))  Urinalysis, Routine w reflex microscopic     Status: Abnormal   Collection Time: 07/08/20  7:56 PM  Result Value Ref Range   Color, Urine YELLOW YELLOW   APPearance HAZY (A) CLEAR   Specific Gravity, Urine 1.009 1.005 - 1.030   pH 7.0 5.0 - 8.0   Glucose, UA NEGATIVE NEGATIVE mg/dL   Hgb urine dipstick NEGATIVE NEGATIVE   Bilirubin Urine NEGATIVE NEGATIVE   Ketones, ur NEGATIVE NEGATIVE mg/dL   Protein, ur NEGATIVE NEGATIVE mg/dL   Nitrite NEGATIVE NEGATIVE   Leukocytes,Ua NEGATIVE NEGATIVE  Fetal fibronectin     Status: None   Collection Time: 07/08/20  8:29 PM  Result Value Ref Range   Fetal Fibronectin NEGATIVE NEGATIVE     Imaging:  No results found.  MAU Course/MDM: I have ordered labs and reviewed results. UA is clear.  Fetal fibronectin is Negative NST reviewed, reactive Updated Dr Nelda Marseille who called to check on patient, with presentation, exam findings and test results.  Treatments in MAU included Procardia series.x 4 doses Her contractions diminished and though her irritability  Persisted, she stated that it was no longer  painful   Since her FFn was negative, will discharge home with close followup in office   Assessment: Single IUP at 62w3dPreterm uterine irritability with some effacement but negative FFn   Plan: Discharge home Preterm Labor precautions and fetal kick counts Follow up in Office for prenatal visits and recheck of cervix  Encouraged to return here or to other Urgent Care/ED if she develops worsening of symptoms, increase in pain, fever, or other concerning symptoms.   Pt stable at time of discharge.  MHansel FeinsteinCNM, MSN Certified Nurse-Midwife 07/08/2020 8:08 PM

## 2020-07-08 NOTE — Progress Notes (Signed)
Written and verbal d/c instructions given and understanding voiced. To f/u with Dr Charlotta Newton next wk

## 2020-07-08 NOTE — Progress Notes (Signed)
OK to d/c transducer for FHR per Artelia Laroche CNM. Will cont toco

## 2020-07-08 NOTE — Discharge Instructions (Signed)

## 2020-07-11 ENCOUNTER — Other Ambulatory Visit: Payer: Self-pay

## 2020-07-11 ENCOUNTER — Encounter (HOSPITAL_COMMUNITY): Payer: Self-pay | Admitting: Obstetrics & Gynecology

## 2020-07-11 ENCOUNTER — Inpatient Hospital Stay (HOSPITAL_COMMUNITY)
Admission: AD | Admit: 2020-07-11 | Discharge: 2020-07-11 | Disposition: A | Discharge status: Home / Self Care | Payer: BC Managed Care – PPO | Attending: Obstetrics & Gynecology | Admitting: Obstetrics & Gynecology

## 2020-07-11 DIAGNOSIS — F17210 Nicotine dependence, cigarettes, uncomplicated: Secondary | ICD-10-CM | POA: Insufficient documentation

## 2020-07-11 DIAGNOSIS — Z0371 Encounter for suspected problem with amniotic cavity and membrane ruled out: Secondary | ICD-10-CM

## 2020-07-11 DIAGNOSIS — F319 Bipolar disorder, unspecified: Secondary | ICD-10-CM | POA: Insufficient documentation

## 2020-07-11 DIAGNOSIS — Z3A33 33 weeks gestation of pregnancy: Secondary | ICD-10-CM

## 2020-07-11 DIAGNOSIS — Z79899 Other long term (current) drug therapy: Secondary | ICD-10-CM | POA: Diagnosis not present

## 2020-07-11 DIAGNOSIS — O99333 Smoking (tobacco) complicating pregnancy, third trimester: Secondary | ICD-10-CM | POA: Diagnosis not present

## 2020-07-11 DIAGNOSIS — O99343 Other mental disorders complicating pregnancy, third trimester: Secondary | ICD-10-CM | POA: Diagnosis not present

## 2020-07-11 DIAGNOSIS — Z3689 Encounter for other specified antenatal screening: Secondary | ICD-10-CM

## 2020-07-11 LAB — POCT FERN TEST: POCT Fern Test: NEGATIVE

## 2020-07-11 NOTE — MAU Provider Note (Signed)
Chief Complaint:  Rupture of Membranes   First Provider Initiated Contact with Patient 07/11/20 2058     HPI: Isabel Chang is a 19 y.o. G1P0 at 60w6dwho presents to maternity admissions reporting possible leaking of fluid. Reports a gush of thin discharge at 630 pm. Feels like something has continued to come out. No odor and appears clear. No regular contractions or vaginal bleeding. Good fetal movement. No recent intercourse. Has appointment with Dr. ONelda Marseillethis Thursday.    Pregnancy Course[facility]  Past Medical History:  Diagnosis Date  . Anxiety   . Bipolar disorder ([place]   . Chronic back pain   . Depression   . Gestational diabetes    OB History  Gravida Para Term Preterm AB Living  1            SAB TAB Ectopic Multiple Live Births               # Outcome Date GA Lbr Len/2nd Weight Sex Delivery Anes PTL Lv  1 Current            Past Surgical History:  Procedure Laterality Date  . ADENOIDECTOMY    . TYMPANOSTOMY    . WISDOM TOOTH EXTRACTION     Family History  Problem Relation Age of Onset  . Diabetes Mother    Social History   Tobacco Use  . Smoking status: Current Some Day Smoker    Packs/day: 0.25    Types: Cigarettes  . Smokeless tobacco: Never Used  Vaping Use  . Vaping Use: Every day  Substance Use Topics  . Alcohol use: Not Currently  . Drug use: Not Currently    Types: Marijuana    Comment: May was last usage   No Known Allergies Medications Prior to Admission  Medication Sig Dispense Refill Last Dose  . Blood Glucose Monitoring Suppl (ACCU-CHEK GUIDE) w/Device KIT AS DIRECTED FINGER STICK   07/11/2020 at Unknown time  . ferrous sulfate 325 (65 FE) MG EC tablet Take 325 mg by mouth 3 (three) times daily with meals.   07/11/2020 at Unknown time  . Prenatal Vit-Fe Fumarate-FA (PRENATAL VITAMINS PO) Take by mouth.   07/11/2020 at Unknown time  . CONTOUR NEXT TEST test strip as directed.     . ergocalciferol (VITAMIN  D2) 1.25 MG (50000 UT) capsule ergocalciferol (vitamin D2) 1,250 mcg (50,000 unit) capsule  TK 1 C PO Q WK IN THE MORNING    at not taking  . fluticasone (FLONASE) 50 MCG/ACT nasal spray fluticasone propionate 50 mcg/actuation nasal spray,suspension  SHAKE LQ AND U 1 SPR NASALLY QD IN THE MORNING    at not taking  . lamoTRIgine (LAMICTAL) 100 MG tablet Take 100 mg by mouth daily.     .Marland Kitchenloratadine (CLARITIN) 10 MG tablet loratadine 10 mg tablet  TK 1 T PO QD IN THE MORNING    at not taking  . Microlet Lancets MISC 4 (four) times daily.     . ondansetron (ZOFRAN-ODT) 4 MG disintegrating tablet ondansetron 4 mg disintegrating tablet    at not taking    I have reviewed patient's Past Medical Hx, Surgical Hx, Family Hx, Social Hx, medications and allergies.   ROS:  Review of Systems  Constitutional: Negative.   Gastrointestinal: Negative.   Genitourinary: Positive for vaginal discharge. Negative for vaginal bleeding.    Physical Exam   Patient Vitals for the past 24 hrs:  BP Temp Pulse Resp  Weight  07/11/20 2021 (!) 119/62 98.6 F (37 C) 85 17 83 kg    Constitutional: Well-developed, well-nourished female in no acute distress.  Cardiovascular: normal rate & rhythm, no murmur Respiratory: normal effort, lung sounds clear throughout GI: Abd soft, non-tender, gravid appropriate for gestational age. Pos BS x 4 MS: Extremities nontender, no edema, normal ROM Neurologic: Alert and oriented x 4.  GU:      Pelvic: NEFG, no blood. No pooling of fluid. Cervix visually closed     Fetal Tracing:  Baseline: 145 Variability: moderate Accelerations: 15x15 Decelerations: none  Toco: irregular UI    Labs: Results for orders placed or performed during the hospital encounter of 07/11/20 (from the past 24 hour(s))  POCT fern test     Status: Normal   Collection Time: 07/11/20  9:10 PM  Result Value Ref Range   POCT Fern Test Negative = intact amniotic membranes     Imaging:  No results  found.  MAU Course: Orders Placed This Encounter  Procedures  . POCT fern test  . Discharge patient   No orders of the defined types were placed in this encounter.   MDM: Reactive NST  Sterile spec exam performed; no pooling of fluid. Negative fern test  Assessment: 1. Encounter for suspected PROM, with rupture of membranes not found   2. [redacted] weeks gestation of pregnancy   3. NST (non-stress test) reactive     Plan: Discharge home in stable condition.  Discussed reasons to return to MAU Keep appt with Dr. Nelda Marseille later this week    Follow-up Information    Gynecology, [place] Follow up.   Specialty: Obstetrics and Gynecology Contact information: [address] [place] [address] [phone]               Allergies as of 07/11/2020   No Known Allergies     Medication List    TAKE these medications   Accu-Chek Guide w/Device Kit AS DIRECTED FINGER STICK   Contour Next Test test strip Generic drug: glucose blood as directed.   ergocalciferol 1.25 MG (50000 UT) capsule Commonly known as: VITAMIN D2 ergocalciferol (vitamin D2) 1,250 mcg (50,000 unit) capsule  TK 1 C PO Q WK IN THE MORNING   ferrous sulfate 325 (65 FE) MG EC tablet Take 325 mg by mouth 3 (three) times daily with meals.   fluticasone 50 MCG/ACT nasal spray Commonly known as: FLONASE fluticasone propionate 50 mcg/actuation nasal spray,suspension  SHAKE LQ AND U 1 SPR NASALLY QD IN THE MORNING   lamoTRIgine 100 MG tablet Commonly known as: LAMICTAL Take 100 mg by mouth daily.   loratadine 10 MG tablet Commonly known as: CLARITIN loratadine 10 mg tablet  TK 1 T PO QD IN THE MORNING   Microlet Lancets Misc 4 (four) times daily.   ondansetron 4 MG disintegrating tablet Commonly known as: ZOFRAN-ODT ondansetron 4 mg disintegrating tablet   PRENATAL VITAMINS PO Take by mouth.       Jorje Guild, NP 07/11/2020 9:17 PM

## 2020-07-11 NOTE — MAU Note (Signed)
Patient reports having a small gush of clear fluid around 1830.  Denies VB.  Some occasional contractions but nothing regular.  Endorses + FM.

## 2020-07-11 NOTE — Discharge Instructions (Signed)
  Return to MAU for regular painful contractions, leaking of watery fluid, vaginal bleeding, or concerns about fetal movement.    Fetal Movement Counts Patient Name: ________________________________________________ Patient Due Date: ____________________ What is a fetal movement count?  A fetal movement count is the number of times that you feel your baby move during a certain amount of time. This may also be called a fetal kick count. A fetal movement count is recommended for every pregnant woman. You may be asked to start counting fetal movements as early as week 28 of your pregnancy. Pay attention to when your baby is most active. You may notice your baby's sleep and wake cycles. You may also notice things that make your baby move more. You should do a fetal movement count:  When your baby is normally most active.  At the same time each day. A good time to count movements is while you are resting, after having something to eat and drink. How do I count fetal movements? 1. Find a quiet, comfortable area. Sit, or lie down on your side. 2. Write down the date, the start time and stop time, and the number of movements that you felt between those two times. Take this information with you to your health care visits. 3. Write down your start time when you feel the first movement. 4. Count kicks, flutters, swishes, rolls, and jabs. You should feel at least 10 movements. 5. You may stop counting after you have felt 10 movements, or if you have been counting for 2 hours. Write down the stop time. 6. If you do not feel 10 movements in 2 hours, contact your health care provider for further instructions. Your health care provider may want to do additional tests to assess your baby's well-being. Contact a health care provider if:  You feel fewer than 10 movements in 2 hours.  Your baby is not moving like he or she usually does. Date: ____________ Start time: ____________ Stop time: ____________  Movements: ____________ Date: ____________ Start time: ____________ Stop time: ____________ Movements: ____________ Date: ____________ Start time: ____________ Stop time: ____________ Movements: ____________ Date: ____________ Start time: ____________ Stop time: ____________ Movements: ____________ Date: ____________ Start time: ____________ Stop time: ____________ Movements: ____________ Date: ____________ Start time: ____________ Stop time: ____________ Movements: ____________ Date: ____________ Start time: ____________ Stop time: ____________ Movements: ____________ Date: ____________ Start time: ____________ Stop time: ____________ Movements: ____________ Date: ____________ Start time: ____________ Stop time: ____________ Movements: ____________ This information is not intended to replace advice given to you by your health care provider. Make sure you discuss any questions you have with your health care provider. Document Revised: 07/24/2019 Document Reviewed: 07/24/2019 Elsevier Patient Education  2020 [employer].

## 2020-08-16 ENCOUNTER — Telehealth (HOSPITAL_COMMUNITY): Payer: Self-pay | Admitting: *Deleted

## 2020-08-16 ENCOUNTER — Encounter (HOSPITAL_COMMUNITY): Payer: Self-pay | Admitting: *Deleted

## 2020-08-16 NOTE — Telephone Encounter (Signed)
Preadmission screen  

## 2020-08-19 ENCOUNTER — Other Ambulatory Visit (HOSPITAL_COMMUNITY)
Admission: RE | Admit: 2020-08-19 | Discharge: 2020-08-19 | Disposition: A | Discharge status: Home / Self Care | Payer: BC Managed Care – PPO | Source: Ambulatory Visit | Attending: Obstetrics & Gynecology | Admitting: Obstetrics & Gynecology

## 2020-08-19 DIAGNOSIS — Z20822 Contact with and (suspected) exposure to covid-19: Secondary | ICD-10-CM | POA: Insufficient documentation

## 2020-08-19 DIAGNOSIS — Z01818 Encounter for other preprocedural examination: Secondary | ICD-10-CM | POA: Insufficient documentation

## 2020-08-19 LAB — SARS CORONAVIRUS 2 (TAT 6-24 HRS): SARS Coronavirus 2: NEGATIVE

## 2020-08-20 NOTE — H&P (Signed)
HPI: 19 y/o G1P0 @ [redacted]w[redacted]d estimated gestational age (as dated by LMP c/w 20 week ultrasound) presents for IOL GDMA1.   no Leaking of Fluid,   no Vaginal Bleeding,   no Uterine Contractions,  + Fetal Movement.  Prenatal care has been provided by Dr. Charlotta Newton  ROS: no HA, no epigastric pain, no visual changes.    Pregnancy complicated by: 1) GDMA1- diet controlled -Last [place] @ 36w-vertex/post, EFW 6#1oz (40%)  2) h/o bipoloar d/o -currently on Lamotrigine 100mg  daily -followed by psychiatry  3) Anemia- iron daily   Prenatal Transfer Tool  Maternal Diabetes: Yes:  Diabetes Type:  Diet controlled Genetic Screening: Normal Maternal Ultrasounds/Referrals: Normal Fetal Ultrasounds or other Referrals:  None Maternal Substance Abuse:  No Significant Maternal Medications:  Meds include: Other:  Lamotrigine Significant Maternal Lab Results: Group B Strep negative   PNL:  GBS negative, Rub Immune, Hep B neg, RPR NR, HIV neg, GC/C neg, glucola:176, abnormal 3hr Hgb: 10.9 Blood type: O positive, antibody neg  Immunizations: Tdap: 06/17/2020  OBHx: primip PMHx:  Bipolar d/o Meds:  PNV, Lamotrigine Allergy:  No Known Allergies SurgHx: none SocHx:   no Tobacco, no  EtOH, no Illicit Drugs  O: LMP 11/17/2019 (Approximate)  Gen. AAOx3, NAD CV.  RRR  No murmur.  Resp. CTAB, no wheeze or crackles. Abd. Gravid,  no tenderness,  no rigidity,  no guarding Extr.  no edema B/L , no calf tenderness, neg Homan's B/L FHT: 140 by doppler in office SVE: FT/soft/-3, vertex- in office   Labs: see orders  A/P:  19 y.o. G1P0 @ [redacted]w[redacted]d EGA who presents for IOL due to GDMA1 -FWB:  Reassuring by doppler -Labor: plan for IOL with cytotec -GBS: negative -Pain management: IV or epidural upon request -h/o bipolar/mood disorder- continue home medication  [redacted]w[redacted]d, DO [phone] (cell) [phone] (office)

## 2020-08-21 ENCOUNTER — Inpatient Hospital Stay (HOSPITAL_COMMUNITY): Payer: BC Managed Care – PPO

## 2020-08-21 ENCOUNTER — Encounter (HOSPITAL_COMMUNITY): Payer: Self-pay | Admitting: Obstetrics & Gynecology

## 2020-08-21 ENCOUNTER — Other Ambulatory Visit: Payer: Self-pay

## 2020-08-21 ENCOUNTER — Inpatient Hospital Stay (HOSPITAL_COMMUNITY)
Admission: AD | Admit: 2020-08-21 | Discharge: 2020-08-25 | DRG: 807 | Disposition: A | Discharge status: Home / Self Care | Payer: BC Managed Care – PPO | Attending: Obstetrics & Gynecology | Admitting: Obstetrics & Gynecology

## 2020-08-21 DIAGNOSIS — F319 Bipolar disorder, unspecified: Secondary | ICD-10-CM | POA: Diagnosis present

## 2020-08-21 DIAGNOSIS — O24419 Gestational diabetes mellitus in pregnancy, unspecified control: Secondary | ICD-10-CM | POA: Diagnosis present

## 2020-08-21 DIAGNOSIS — F39 Unspecified mood [affective] disorder: Secondary | ICD-10-CM | POA: Diagnosis present

## 2020-08-21 DIAGNOSIS — O24420 Gestational diabetes mellitus in childbirth, diet controlled: Secondary | ICD-10-CM | POA: Diagnosis present

## 2020-08-21 DIAGNOSIS — Z20822 Contact with and (suspected) exposure to covid-19: Secondary | ICD-10-CM | POA: Diagnosis present

## 2020-08-21 DIAGNOSIS — O99344 Other mental disorders complicating childbirth: Secondary | ICD-10-CM | POA: Diagnosis present

## 2020-08-21 DIAGNOSIS — Z3A39 39 weeks gestation of pregnancy: Secondary | ICD-10-CM | POA: Diagnosis not present

## 2020-08-21 LAB — CBC
HCT: 34.9 % — ABNORMAL LOW (ref 36.0–46.0)
Hemoglobin: 11.5 g/dL — ABNORMAL LOW (ref 12.0–15.0)
MCH: 28.3 pg (ref 26.0–34.0)
MCHC: 33 g/dL (ref 30.0–36.0)
MCV: 86 fL (ref 80.0–100.0)
Platelets: 204 10*3/uL (ref 150–400)
RBC: 4.06 MIL/uL (ref 3.87–5.11)
RDW: 13.7 % (ref 11.5–15.5)
WBC: 10.2 10*3/uL (ref 4.0–10.5)
nRBC: 0 % (ref 0.0–0.2)

## 2020-08-21 LAB — GLUCOSE, CAPILLARY: Glucose-Capillary: 87 mg/dL (ref 70–99)

## 2020-08-21 LAB — TYPE AND SCREEN
ABO/RH(D): O POS
Antibody Screen: NEGATIVE

## 2020-08-21 MED ORDER — PHENYLEPHRINE 40 MCG/ML (10ML) SYRINGE FOR IV PUSH (FOR BLOOD PRESSURE SUPPORT): 80.0000 ug | PREFILLED_SYRINGE | INTRAVENOUS | Status: DC | PRN

## 2020-08-21 MED ORDER — FENTANYL-BUPIVACAINE-NACL 0.5-0.125-0.9 MG/250ML-% EP SOLN
12.0000 mL/h | EPIDURAL | Status: DC | PRN
  Administered 2020-08-23: 12 mL/h via EPIDURAL
  Filled 2020-08-21 (×2): qty 250

## 2020-08-21 MED ORDER — DIPHENHYDRAMINE HCL 50 MG/ML IJ SOLN: 12.5000 mg | INTRAMUSCULAR | Status: DC | PRN

## 2020-08-21 MED ORDER — EPHEDRINE 5 MG/ML INJ: 10.0000 mg | INTRAVENOUS | Status: DC | PRN

## 2020-08-21 MED ORDER — OXYTOCIN-SODIUM CHLORIDE 30-0.9 UT/500ML-% IV SOLN
2.5000 [IU]/h | INTRAVENOUS | Status: DC
  Filled 2020-08-21: qty 500

## 2020-08-21 MED ORDER — TERBUTALINE SULFATE 1 MG/ML IJ SOLN: 0.2500 mg | Freq: Once | INTRAMUSCULAR | Status: DC | PRN

## 2020-08-21 MED ORDER — ZOLPIDEM TARTRATE 5 MG PO TABS: 5.0000 mg | ORAL_TABLET | Freq: Every evening | ORAL | Status: DC | PRN

## 2020-08-21 MED ORDER — DIPHENHYDRAMINE HCL 25 MG PO CAPS
25.0000 mg | ORAL_CAPSULE | Freq: Once | ORAL | Status: AC
  Administered 2020-08-21: 25 mg via ORAL
  Filled 2020-08-21: qty 1

## 2020-08-21 MED ORDER — LACTATED RINGERS IV SOLN
INTRAVENOUS | Status: DC
  Administered 2020-08-22 (×2): 125 mL/h via INTRAVENOUS

## 2020-08-21 MED ORDER — SOD CITRATE-CITRIC ACID 500-334 MG/5ML PO SOLN: 30.0000 mL | ORAL | Status: DC | PRN

## 2020-08-21 MED ORDER — ONDANSETRON HCL 4 MG/2ML IJ SOLN
4.0000 mg | Freq: Four times a day (QID) | INTRAMUSCULAR | Status: DC | PRN
  Administered 2020-08-23: 4 mg via INTRAVENOUS
  Filled 2020-08-21: qty 2

## 2020-08-21 MED ORDER — FENTANYL CITRATE (PF) 100 MCG/2ML IJ SOLN
50.0000 ug | INTRAMUSCULAR | Status: DC | PRN
  Administered 2020-08-21: 50 ug via INTRAVENOUS
  Administered 2020-08-22 – 2020-08-23 (×4): 100 ug via INTRAVENOUS
  Filled 2020-08-21 (×6): qty 2

## 2020-08-21 MED ORDER — ACETAMINOPHEN 325 MG PO TABS: 650.0000 mg | ORAL_TABLET | ORAL | Status: DC | PRN

## 2020-08-21 MED ORDER — PHENYLEPHRINE 40 MCG/ML (10ML) SYRINGE FOR IV PUSH (FOR BLOOD PRESSURE SUPPORT)
80.0000 ug | PREFILLED_SYRINGE | INTRAVENOUS | Status: DC | PRN
  Filled 2020-08-21: qty 10

## 2020-08-21 MED ORDER — OXYTOCIN-SODIUM CHLORIDE 30-0.9 UT/500ML-% IV SOLN
1.0000 m[IU]/min | INTRAVENOUS | Status: DC
  Administered 2020-08-22: 2 m[IU]/min via INTRAVENOUS
  Filled 2020-08-21: qty 500

## 2020-08-21 MED ORDER — OXYCODONE-ACETAMINOPHEN 5-325 MG PO TABS: 2.0000 | ORAL_TABLET | ORAL | Status: DC | PRN

## 2020-08-21 MED ORDER — LACTATED RINGERS IV SOLN: 500.0000 mL | Freq: Once | INTRAVENOUS | Status: DC

## 2020-08-21 MED ORDER — OXYTOCIN BOLUS FROM INFUSION: 333.0000 mL | Freq: Once | INTRAVENOUS | Status: DC

## 2020-08-21 MED ORDER — OXYCODONE-ACETAMINOPHEN 5-325 MG PO TABS: 1.0000 | ORAL_TABLET | ORAL | Status: DC | PRN

## 2020-08-21 MED ORDER — LAMOTRIGINE 100 MG PO TABS
100.0000 mg | ORAL_TABLET | Freq: Every day | ORAL | Status: DC
  Administered 2020-08-22 – 2020-08-25 (×4): 100 mg via ORAL
  Filled 2020-08-21 (×6): qty 1

## 2020-08-21 MED ORDER — MISOPROSTOL 25 MCG QUARTER TABLET
25.0000 ug | ORAL_TABLET | ORAL | Status: DC | PRN
  Administered 2020-08-21 – 2020-08-22 (×3): 25 ug via VAGINAL
  Filled 2020-08-21 (×5): qty 1

## 2020-08-21 MED ORDER — LACTATED RINGERS IV SOLN
500.0000 mL | INTRAVENOUS | Status: DC | PRN
  Administered 2020-08-22: 500 mL via INTRAVENOUS

## 2020-08-21 MED ORDER — LIDOCAINE HCL (PF) 1 % IJ SOLN: 30.0000 mL | INTRAMUSCULAR | Status: DC | PRN

## 2020-08-21 NOTE — Progress Notes (Addendum)
OB PN:  S: Pt starting to feel more painful contractions, no acute complaints  O: BP 108/68   Pulse 78   Temp 98.1 F (36.7 C) (Oral)   Resp 16   Ht 5\' 2"  (1.575 m)   Wt 83.4 kg   LMP 11/17/2019 (Approximate)   BMI 33.62 kg/m   FHT: 130bpm, moderate variablity, + accels, no decels Toco: q2-61min SVE: FT/long/high- cytotec placed  A/P: 19 y.o. G1P0 @ [redacted]w[redacted]d 1. FWB: Cat. I 2. Labor: continue cytotec per protocol Pain: IV or epidural upon request GBS: negative GDMA1- 87 on arrival  [redacted]w[redacted]d, Myna [place] (cell) [phone] (office)

## 2020-08-21 NOTE — Progress Notes (Signed)
OB PN:  S: Pt resting comfortably, no acute complaints  O: BP 120/72   Pulse 83   Temp 98.1 F (36.7 C) (Oral)   Resp 16   Ht 5\' 2"  (1.575 m)   Wt 83.4 kg   LMP 11/17/2019 (Approximate)   BMI 33.62 kg/m   FHT: 135bpm, moderate variablity, + accels, mp decels Toco: occasional SVE: deferred  Results for orders placed or performed during the hospital encounter of 08/21/20 (from the past 24 hour(s))  CBC     Status: Abnormal   Collection Time: 08/21/20  2:35 PM  Result Value Ref Range   WBC 10.2 4.0 - 10.5 K/uL   RBC 4.06 3.87 - 5.11 MIL/uL   Hemoglobin 11.5 (L) 12.0 - 15.0 g/dL   HCT 10/21/20 (L) 36 - 46 %   MCV 86.0 80.0 - 100.0 fL   MCH 28.3 26.0 - 34.0 pg   MCHC 33.0 30.0 - 36.0 g/dL   RDW 49.6 75.9 - 16.3 %   Platelets 204 150 - 400 K/uL   nRBC 0.0 0.0 - 0.2 %  Type and screen [facility]     Status: None   Collection Time: 08/21/20  2:35 PM  Result Value Ref Range   ABO/RH(D) O POS    Antibody Screen NEG    Sample Expiration      08/24/2020,2359 Performed at [facility], [address] [place], [place]   Glucose, capillary     Status: None   Collection Time: 08/21/20  3:15 PM  Result Value Ref Range   Glucose-Capillary 87 70 - 99 mg/dL    A/P: 19 y.o. 36 @ [redacted]w[redacted]d for IOL due to GDMA1 1. FWB: Cat. I 2. Labor: continue cytotec per protocol Pain: IV or epidural upon request GBS: negative GDMA1: labs as above, will repeat accucheck as clinically indicated  [redacted]w[redacted]d, DO [phone] (cell) [phone] (office)

## 2020-08-22 ENCOUNTER — Inpatient Hospital Stay (HOSPITAL_COMMUNITY): Payer: BC Managed Care – PPO | Admitting: Anesthesiology

## 2020-08-22 LAB — RPR: RPR Ser Ql: NONREACTIVE

## 2020-08-22 LAB — GLUCOSE, CAPILLARY: Glucose-Capillary: 81 mg/dL (ref 70–99)

## 2020-08-22 MED ORDER — BUPIVACAINE HCL (PF) 0.75 % IJ SOLN
INTRAMUSCULAR | Status: DC | PRN
Start: 2020-08-22 — End: 2020-08-23
  Administered 2020-08-22: 12 mL/h via EPIDURAL

## 2020-08-22 MED ORDER — MISOPROSTOL 25 MCG QUARTER TABLET
25.0000 ug | ORAL_TABLET | Freq: Once | ORAL | Status: AC
  Administered 2020-08-22: 25 ug via VAGINAL

## 2020-08-22 MED ORDER — LACTATED RINGERS IV SOLN: 500.0000 mL | Freq: Once | INTRAVENOUS | Status: DC

## 2020-08-22 MED ORDER — LIDOCAINE HCL (PF) 1 % IJ SOLN
INTRAMUSCULAR | Status: DC | PRN
  Administered 2020-08-22 (×2): 4 mL via EPIDURAL

## 2020-08-22 NOTE — Anesthesia Procedure Notes (Signed)
Epidural Patient location during procedure: OB Start time: 08/22/2020 1:14 PM End time: 08/22/2020 1:17 PM  Staffing Anesthesiologist: Kaylyn Layer, MD Performed: anesthesiologist   Preanesthetic Checklist Completed: patient identified, IV checked, risks and benefits discussed, monitors and equipment checked, pre-op evaluation and timeout performed  Epidural Patient position: sitting Prep: DuraPrep and site prepped and draped Patient monitoring: continuous pulse ox, blood pressure and heart rate Approach: midline Location: L3-L4 Injection technique: LOR air  Needle:  Needle type: Tuohy  Needle gauge: 17 G Needle length: 9 cm Needle insertion depth: 5 cm Catheter type: closed end flexible Catheter size: 19 Gauge Catheter at skin depth: 10 cm Test dose: negative and Other (1% lidocaine)  Assessment Events: blood not aspirated, injection not painful, no injection resistance, no paresthesia and negative IV test  Additional Notes Patient identified. Risks, benefits, and alternatives discussed with patient including but not limited to bleeding, infection, nerve damage, paralysis, failed block, incomplete pain control, headache, blood pressure changes, nausea, vomiting, reactions to medication, itching, and postpartum back pain. Confirmed with bedside nurse the patient's most recent platelet count. Confirmed with patient that they are not currently taking any anticoagulation, have any bleeding history, or any family history of bleeding disorders. Patient expressed understanding and wished to proceed. All questions were answered. Sterile technique was used throughout the entire procedure. Please see nursing notes for vital signs.   Crisp LOR after one needle redirection, mild-moderate lumbar dextroscoliosis noted. Test dose was given through epidural catheter and negative prior to continuing to dose epidural or start infusion. Warning signs of high block given to the patient including  shortness of breath, tingling/numbness in hands, complete motor block, or any concerning symptoms with instructions to call for help. Patient was given instructions on fall risk and not to get out of bed. All questions and concerns addressed with instructions to call with any issues or inadequate analgesia.  Reason for block:procedure for pain

## 2020-08-22 NOTE — Progress Notes (Signed)
OB PN:  S: Pt resting comfortably, no acute complaints  O: BP (!) 101/59   Pulse 67   Temp 98.6 F (37 C) (Oral)   Resp 16   Ht 5\' 2"  (1.575 m)   Wt 83.4 kg   LMP 11/17/2019 (Approximate)   SpO2 98%   BMI 33.62 kg/m   FHT: 140bpm, moderate variablity, + accels, no decels Toco: q40min SVE: 5/60/-3, AROM clear  A/P: 19 y.o. G1P0 @ [redacted]w[redacted]d for IOL due to GDMA1 1. FWB: Cat. I 2. Labor: continue Pit per protocol, IUPC placed for fetal titration Pain: continue epidural GBS: negative  [redacted]w[redacted]d, DO [phone] (cell) [phone] (office)

## 2020-08-22 NOTE — Progress Notes (Signed)
OB PN:  S: Awoken this am from sleep, reporting moderate pain   O: BP 108/65   Pulse 66   Temp 98.3 F (36.8 C) (Oral)   Resp 18   Ht 5\' 2"  (1.575 m)   Wt 83.4 kg   LMP 11/17/2019 (Approximate)   BMI 33.62 kg/m   FHT: 140bpm, moderate variablity, + accels, no decels Toco: q2-12min SVE:1.5/thick/-3, Cook balloon- initially attempted by SSE, placed with manual exam  Results for orders placed or performed during the hospital encounter of 08/21/20 (from the past 24 hour(s))  CBC     Status: Abnormal   Collection Time: 08/21/20  2:35 PM  Result Value Ref Range   WBC 10.2 4.0 - 10.5 K/uL   RBC 4.06 3.87 - 5.11 MIL/uL   Hemoglobin 11.5 (L) 12.0 - 15.0 g/dL   HCT 10/21/20 (L) 36 - 46 %   MCV 86.0 80.0 - 100.0 fL   MCH 28.3 26.0 - 34.0 pg   MCHC 33.0 30.0 - 36.0 g/dL   RDW 39.7 67.3 - 41.9 %   Platelets 204 150 - 400 K/uL   nRBC 0.0 0.0 - 0.2 %  Type and screen [facility]     Status: None   Collection Time: 08/21/20  2:35 PM  Result Value Ref Range   ABO/RH(D) O POS    Antibody Screen NEG    Sample Expiration      08/24/2020,2359 Performed at [facility], [address], [place], [place]   Glucose, capillary     Status: None   Collection Time: 08/21/20  3:15 PM  Result Value Ref Range   Glucose-Capillary 87 70 - 99 mg/dL  Glucose, capillary     Status: None   Collection Time: 08/22/20  8:06 AM  Result Value Ref Range   Glucose-Capillary 81 70 - 99 mg/dL     A/P: 19 y.o. 36 @ [redacted]w[redacted]d for IOL due to GDMA1 1. FWB: Cat. I 2. Labor: s/p cytotec #4, Cook placed, plan to transition to Pitocin later today Pain: IV or epidural upon request GBS: negative GDMA1- remain stable as above Mood d/o, h/o bipolar- continue Lamotrigine 100mg  daily  [redacted]w[redacted]d, DO [phone] (cell) [phone] (office)

## 2020-08-22 NOTE — Progress Notes (Signed)
Vertex verified by bedside U/S.

## 2020-08-22 NOTE — Anesthesia Preprocedure Evaluation (Signed)
Anesthesia Evaluation  Patient identified by MRN, date of birth, ID band Patient awake    Reviewed: Allergy & Precautions, Patient's Chart, lab work & pertinent test results  History of Anesthesia Complications Negative for: history of anesthetic complications  Airway Mallampati: II  TM Distance: >3 FB Neck ROM: Full    Dental no notable dental hx.    Pulmonary Current Smoker,    Pulmonary exam normal        Cardiovascular negative cardio ROS Normal cardiovascular exam     Neuro/Psych Anxiety Depression Bipolar Disorder negative neurological ROS     GI/Hepatic negative GI ROS, Neg liver ROS,   Endo/Other  diabetes, Gestational  Renal/GU negative Renal ROS  negative genitourinary   Musculoskeletal negative musculoskeletal ROS (+)   Abdominal   Peds  Hematology negative hematology ROS (+)   Anesthesia Other Findings Day of surgery medications reviewed with patient.  Reproductive/Obstetrics (+) Pregnancy                             Anesthesia Physical Anesthesia Plan  ASA: II  Anesthesia Plan: Epidural   Post-op Pain Management:    Induction:   PONV Risk Score and Plan: Treatment may vary due to age or medical condition  Airway Management Planned: Natural Airway  Additional Equipment:   Intra-op Plan:   Post-operative Plan:   Informed Consent: I have reviewed the patients History and Physical, chart, labs and discussed the procedure including the risks, benefits and alternatives for the proposed anesthesia with the patient or authorized representative who has indicated his/her understanding and acceptance.       Plan Discussed with:   Anesthesia Plan Comments:         Anesthesia Quick Evaluation

## 2020-08-23 ENCOUNTER — Encounter (HOSPITAL_COMMUNITY): Payer: Self-pay | Admitting: Obstetrics & Gynecology

## 2020-08-23 LAB — GLUCOSE, CAPILLARY: Glucose-Capillary: 73 mg/dL (ref 70–99)

## 2020-08-23 MED ORDER — SIMETHICONE 80 MG PO CHEW: 80.0000 mg | CHEWABLE_TABLET | ORAL | Status: DC | PRN

## 2020-08-23 MED ORDER — PRENATAL MULTIVITAMIN CH
1.0000 | ORAL_TABLET | Freq: Every day | ORAL | Status: DC
  Administered 2020-08-23 – 2020-08-24 (×2): 1 via ORAL
  Filled 2020-08-23 (×2): qty 1

## 2020-08-23 MED ORDER — BUPIVACAINE HCL (PF) 0.25 % IJ SOLN
INTRAMUSCULAR | Status: DC | PRN
  Administered 2020-08-22: 8 mL via EPIDURAL

## 2020-08-23 MED ORDER — SENNOSIDES-DOCUSATE SODIUM 8.6-50 MG PO TABS
2.0000 | ORAL_TABLET | ORAL | Status: DC
  Administered 2020-08-23 – 2020-08-25 (×2): 2 via ORAL
  Filled 2020-08-23 (×2): qty 2

## 2020-08-23 MED ORDER — ZOLPIDEM TARTRATE 5 MG PO TABS: 5.0000 mg | ORAL_TABLET | Freq: Every evening | ORAL | Status: DC | PRN

## 2020-08-23 MED ORDER — FLUTICASONE PROPIONATE 50 MCG/ACT NA SUSP
1.0000 | Freq: Every day | NASAL | Status: DC | PRN
  Filled 2020-08-23: qty 16

## 2020-08-23 MED ORDER — ACETAMINOPHEN 325 MG PO TABS
650.0000 mg | ORAL_TABLET | ORAL | Status: DC | PRN
  Administered 2020-08-23 – 2020-08-25 (×3): 650 mg via ORAL
  Filled 2020-08-23 (×3): qty 2

## 2020-08-23 MED ORDER — DIPHENHYDRAMINE HCL 25 MG PO CAPS: 25.0000 mg | ORAL_CAPSULE | Freq: Four times a day (QID) | ORAL | Status: DC | PRN

## 2020-08-23 MED ORDER — FENTANYL CITRATE (PF) 100 MCG/2ML IJ SOLN
INTRAMUSCULAR | Status: DC | PRN
Start: 2020-08-22 — End: 2020-08-23
  Administered 2020-08-22: 100 ug via EPIDURAL

## 2020-08-23 MED ORDER — BENZOCAINE-MENTHOL 20-0.5 % EX AERO
1.0000 "application " | INHALATION_SPRAY | CUTANEOUS | Status: DC | PRN
  Administered 2020-08-24: 1 via TOPICAL
  Filled 2020-08-23: qty 56

## 2020-08-23 MED ORDER — HYDROXYZINE HCL 25 MG PO TABS: 25.0000 mg | ORAL_TABLET | Freq: Four times a day (QID) | ORAL | Status: DC | PRN

## 2020-08-23 MED ORDER — IBUPROFEN 600 MG PO TABS
600.0000 mg | ORAL_TABLET | Freq: Four times a day (QID) | ORAL | Status: DC
  Administered 2020-08-23 – 2020-08-25 (×6): 600 mg via ORAL
  Filled 2020-08-23 (×6): qty 1

## 2020-08-23 MED ORDER — COCONUT OIL OIL: 1.0000 "application " | TOPICAL_OIL | Status: DC | PRN

## 2020-08-23 MED ORDER — DIBUCAINE (PERIANAL) 1 % EX OINT: 1.0000 "application " | TOPICAL_OINTMENT | CUTANEOUS | Status: DC | PRN

## 2020-08-23 MED ORDER — ONDANSETRON HCL 4 MG/2ML IJ SOLN: 4.0000 mg | INTRAMUSCULAR | Status: DC | PRN

## 2020-08-23 MED ORDER — WITCH HAZEL-GLYCERIN EX PADS: 1.0000 "application " | MEDICATED_PAD | CUTANEOUS | Status: DC | PRN

## 2020-08-23 MED ORDER — ONDANSETRON HCL 4 MG PO TABS: 4.0000 mg | ORAL_TABLET | ORAL | Status: DC | PRN

## 2020-08-23 MED ORDER — LORATADINE 10 MG PO TABS: 10.0000 mg | ORAL_TABLET | Freq: Every day | ORAL | Status: DC | PRN

## 2020-08-23 NOTE — Lactation Note (Signed)
This note was copied from a baby's chart. Lactation Consultation Note  Patient Name: Isabel Chang Today's Date: 08/23/2020   Maralyn Sago, RN called me on Vocera while in Mom's room stating that Mom was going to formula feed b/c of her medications. RN had me on speaker phone. I knew that Mom was on lamotrigine (L2); Mom also shared that she was going to restart her hydroxyzine (L2). I explained that both meds were in safe breastfeeding categories, although it was recommended that infant's blood levels be intermittently monitored for the lamotrigine levels.   RN told me that Mom heard me and would think about it.   Lurline Hare [facility] 08/23/2020, 2:46 PM

## 2020-08-23 NOTE — Anesthesia Postprocedure Evaluation (Signed)
Anesthesia Post Note  Patient: [name]  Procedure(s) Performed: AN AD HOC LABOR EPIDURAL     Patient location during evaluation: Mother Baby Anesthesia Type: Epidural Level of consciousness: awake and alert and oriented Pain management: satisfactory to patient Vital Signs Assessment: post-procedure vital signs reviewed and stable Respiratory status: respiratory function stable Cardiovascular status: stable Postop Assessment: no headache, no backache, epidural receding, patient able to bend at knees, no signs of nausea or vomiting, adequate PO intake and able to ambulate Anesthetic complications: no   No complications documented.  Last Vitals:  Vitals:   08/23/20 1453 08/23/20 1657  BP: (!) 112/55 114/60  Pulse: (!) 102 94  Resp: 18 18  Temp: 36.8 C 36.8 C  SpO2: 98% 99%    Last Pain:  Vitals:   08/23/20 1657  TempSrc:   PainSc: 3    Pain Goal:                   Isabel Chang

## 2020-08-23 NOTE — Progress Notes (Signed)
OB PN:  S: She   O: BP 116/67   Pulse (!) 104   Temp 99.2 F (37.3 C) (Oral)   Resp 16   Ht 5\' 2"  (1.575 m)   Wt 83.4 kg   LMP 11/17/2019 (Approximate)   SpO2 98%   BMI 33.62 kg/m   FHT: 135bpm, moderate variablity, + accels, no decels Toco: q44min SVE: 9/100/-1, IUPC in place  A/P: 19 y.o. G1P0 @ [redacted]w[redacted]d for IOL due to GDMA1 1. FWB: Cat. I 2. Labor: continue Pitocin Pain: continue epidural GBS: negative  [redacted]w[redacted]d, DO [phone] (cell) [phone] (office)

## 2020-08-24 LAB — CBC
HCT: 28.6 % — ABNORMAL LOW (ref 36.0–46.0)
Hemoglobin: 9.8 g/dL — ABNORMAL LOW (ref 12.0–15.0)
MCH: 29.3 pg (ref 26.0–34.0)
MCHC: 34.3 g/dL (ref 30.0–36.0)
MCV: 85.6 fL (ref 80.0–100.0)
Platelets: 162 10*3/uL (ref 150–400)
RBC: 3.34 MIL/uL — ABNORMAL LOW (ref 3.87–5.11)
RDW: 13.9 % (ref 11.5–15.5)
WBC: 18.8 10*3/uL — ABNORMAL HIGH (ref 4.0–10.5)
nRBC: 0 % (ref 0.0–0.2)

## 2020-08-24 NOTE — Progress Notes (Signed)
Post Partum Day 1 Subjective: no complaints, up ad lib, voiding and tolerating PO  Objective: Blood pressure (!) 98/46, pulse 71, temperature 97.6 F (36.4 C), temperature source Oral, resp. rate 18, height 5\' 2"  (1.575 m), weight 83.4 kg, last menstrual period 11/17/2019, SpO2 100 %, unknown if currently breastfeeding.  Physical Exam:  General: alert, cooperative and no distress Lochia: appropriate Uterine Fundus: firm Incision: NA DVT Evaluation: No evidence of DVT seen on physical exam.  Recent Labs    08/21/20 1435 08/24/20 0546  HGB 11.5* 9.8*  HCT 34.9* 28.6*    Assessment/Plan: Plan for discharge tomorrow  Routine postpatum care  Dr. 10/24/20 to assume care tomorrow 08/25/2020   LOS: 3 days   10/25/2020 08/24/2020, 9:04 AM

## 2020-08-24 NOTE — Clinical Social Work Maternal (Signed)
CLINICAL SOCIAL WORK MATERNAL/CHILD NOTE  Patient Details  Name: Isabel Chang MRN: [id number] Date of Birth: 2001-01-21  Date:  08/24/2020  Clinical Social Worker Initiating Note:  Hortencia Pilar, LCSW Date/Time: Initiated:  08/24/20/0900     Child's Name:  Leatrice Jewels   Biological Parents:  Mother, Father (Shaketa Buescher, Evern Bio)   Need for Interpreter:  None   Reason for Referral:  Behavioral Health Concerns   Address:  [address] [place] [address]    Phone number:  [phone] (home) [phone] (work)    Additional phone number: none   Household Members/Support Persons (HM/SP):   Household Member/Support Person 1   HM/SP Name Relationship DOB or Age  HM/SP -1  Dondi Galati MOB 14[birth year]-1999  HM/SP -2 Gabriel Rung Grisso Brother 09/18/2997  HM/SP -3        HM/SP -4        HM/SP -5        HM/SP -6        HM/SP -7        HM/SP -8          Natural Supports (not living in the home):  Parent, Other (Comment) (FOB lives in [place].)   Professional Supports: Therapist   Employment: Unemployed   Type of Work:   none. Mob reports that she plans to obtain a jon once her 6 weeks is up.   Education:  High school graduate   Homebound arranged:  n/a  Surveyor, quantity Resources:  Water engineer)   Other Resources:  Sales executive , [facility]   Cultural/Religious Considerations Which May Impact Care:  none   Strengths:  Ability to meet basic needs , Compliance with medical plan , Home prepared for child , Psychotropic Medications, Pediatrician chosen   Psychotropic Medications:  Lamictal      Pediatrician:    Armed forces operational officer area  Pediatrician List:   [place].)  [place]    [place]    [place] [facility]      Pediatrician Fax Number:    Risk Factors/Current Problems:  None   Cognitive State:  Insightful , Able to Concentrate , Alert    Mood/Affect:  Happy , Interested ,  Relaxed , Comfortable , Calm    CSW Assessment: CSW consulted as MOB has hx of Bipolar, anxiety and depression. CSW went to speak with MOB at bedside to address further needs.   CSW congratulated MOB on the birth of infant. CSW advised MOB of the HIPPA policy in which MOB was agreeable to FOB remaining in the room while CSW spoke with her.CSW understanding and advised MOB of CSW's role and the reason for CSW coming to speak with her. MOB reported that she was does have anxiety, depression and Bipolar as her diagnosis. MOB expressed being in treatment at [facility] to assist with mental health diagnosis. MOB reported that she has been going to [facility] for about three months where she receives  medication management as well as therapy with Arlana Hove. MOB reported that she feels that her therapist and the medication use are working well for her in managing her mental health needs. MOB reported that she tends to see her therapist every other week however once her 6 week's is up, she would be seeing therapist once a month. MOB reported that she feels happy and denies SI and HI.   CSW inquired from MOB on other mental health hx in which  MOB denies no other hx.  MOB reported that she has support from [place] however he lives in [place]. MOB reported that she lives with her brother who MOB reported is a support for her along with her mom. MOB expressed that she has already been connected with other resources to obtain supplies for infant. MOB expressed that she has all other needed items to care for infant with no other needs. MOB expressed that she gets [facility] and Sales executive.   CSW took time to provide MOB with PPD and SIDS education. MOB was given PPD Checklist to keep track of feelings as they relate to PPD. MOB was advised of SIDS and reported that infant would sleep in basinet once arrived home. CSW identified no other needs at this time and foresees no barriers to d/c.   CSW Plan/Description:  No Further  Intervention Required/No Barriers to Discharge, Sudden Infant Death Syndrome (SIDS) Education, Perinatal Mood and Anxiety Disorder (PMADs) Education    Robb Matar, LCSWA 08/24/2020, 9:17 AM

## 2020-08-25 MED ORDER — IBUPROFEN 600 MG PO TABS: 600.0000 mg | ORAL_TABLET | Freq: Four times a day (QID) | ORAL | 0 refills | Status: DC | PRN

## 2020-08-25 MED ORDER — HYDROXYZINE HCL 25 MG PO TABS: 25.0000 mg | ORAL_TABLET | Freq: Four times a day (QID) | ORAL | 0 refills | Status: DC | PRN

## 2020-08-25 NOTE — Discharge Summary (Signed)
Postpartum Discharge Summary     Patient Name: Isabel Chang DOB: Apr 23, 2001 MRN: [id number]  Date of admission: 08/21/2020 Delivery date:08/23/2020  Delivering provider: Janyth Chang  Date of discharge: 08/25/2020  Admitting diagnosis: Gestational diabetes [O24.419] Intrauterine pregnancy: [redacted]w[redacted]d    Secondary diagnosis:  Active Problems:   Gestational diabetes  Additional problems: h/o bipolar/mood disorder    Discharge diagnosis: Term Pregnancy Delivered and GDM A1                                              Post partum procedures:none Augmentation: AROM, Pitocin, Cytotec and IP Foley Complications: None  Hospital course: Induction of Labor With Vaginal Delivery   19y.o. yo G1P1001 at 21w0das admitted to the hospital 08/21/2020 for induction of labor.  Indication for induction: A1 DM.  Patient had an uncomplicated labor course as follows: Membrane Rupture Time/Date: 6:14 PM ,08/22/2020   Delivery Method:Vaginal, Spontaneous  Episiotomy: None  Lacerations:  Labial  Details of delivery can be found in separate delivery note.  Patient had a routine postpartum course. Patient is discharged home 08/25/20.  Newborn Data: Birth date:08/23/2020  Birth time:10:51 AM  Gender:Female  Living status:Living  Apgars:8 ,9  Weight:2975 g   Magnesium Sulfate received: No BMZ received: No Rhophylac:No MMR:No T-DaP:Given prenatally Flu: N/A Transfusion:No  Physical exam  Vitals:   08/24/20 0540 08/24/20 1429 08/24/20 1955 08/25/20 0605  BP: (!) 98/46 (!) 104/56 116/68 121/68  Pulse: 71 88 88 69  Resp: 18 18 16 18   Temp: 97.6 F (36.4 C) 98.3 F (36.8 C) 98.3 F (36.8 C) 98.4 F (36.9 C)  TempSrc: Oral Oral Oral Oral  SpO2: 100% 99% 100%   Weight:      Height:       General: alert, cooperative and no distress Lochia: appropriate Uterine Fundus: firm Incision: N/A DVT Evaluation: No evidence of DVT seen on physical exam. Labs: Lab Results  Component Value Date   WBC  18.8 (H) 08/24/2020   HGB 9.8 (L) 08/24/2020   HCT 28.6 (L) 08/24/2020   MCV 85.6 08/24/2020   PLT 162 08/24/2020   CMP Latest Ref Rng & Units 12/02/2018  Glucose 65 - 99 mg/dL 76  BUN 7 - 20 mg/dL 9  Creatinine 0.50 - 1.00 mg/dL 0.82  Sodium 135 - 146 mmol/L 139  Potassium 3.8 - 5.1 mmol/L 4.6  Chloride 98 - 110 mmol/L 102  CO2 20 - 32 mmol/L 25  Calcium 8.9 - 10.4 mg/dL 10.3  Total Protein 6.3 - 8.2 g/dL 7.5  Total Bilirubin 0.2 - 1.1 mg/dL 0.7  AST 12 - 32 U/L 16  ALT 5 - 32 U/L 12   Edinburgh Score: Edinburgh Postnatal Depression Scale Screening Tool 08/24/2020  I have been able to laugh and see the funny side of things. (No Data)      After visit meds:  Allergies as of 08/25/2020   No Known Allergies     Medication List    STOP taking these medications   Accu-Chek Guide w/Device Kit   Contour Next Test test strip Generic drug: glucose blood   Microlet Lancets Misc     TAKE these medications   acetaminophen 325 MG tablet Commonly known as: TYLENOL Take 650 mg by mouth every 6 (six) hours as needed for mild pain.   hydrOXYzine 25 MG tablet  Commonly known as: ATARAX/VISTARIL Take 1 tablet (25 mg total) by mouth every 6 (six) hours as needed for anxiety.   ibuprofen 600 MG tablet Commonly known as: ADVIL Take 1 tablet (600 mg total) by mouth every 6 (six) hours as needed for headache, mild pain, moderate pain or cramping.   lamoTRIgine 100 MG tablet Commonly known as: LAMICTAL Take 100 mg by mouth daily.   PRENATAL VITAMINS PO Take 1 tablet by mouth daily.        Discharge home in stable condition Infant Feeding: Bottle Infant Disposition:home with mother Discharge instruction: per After Visit Summary and Postpartum booklet. Activity: Advance as tolerated. Pelvic rest for 6 weeks.  Diet: routine diet Anticipated Birth Control: IUD Postpartum Appointment:3 weeks Additional Postpartum F/U: Postpartum Depression checkup Future Appointments:No  future appointments. Follow up Visit:  Follow-up Information    Isabel Pupa, DO Follow up in 3 week(s).   Specialty: Obstetrics and Gynecology Contact information: [address] [place] [address] [phone]                   08/25/2020 Isabel Genta, DO

## 2020-08-25 NOTE — Discharge Instructions (Signed)

## 2021-02-02 IMAGING — US US MFM OB DETAIL+14 WK
1 series · 14 of 28 positions shown · non-contrast
Comparison: none

[Series 1: us mfm ob detail+14 wk · 111 acquisitions, 14 frames shown]
[im 5/111]
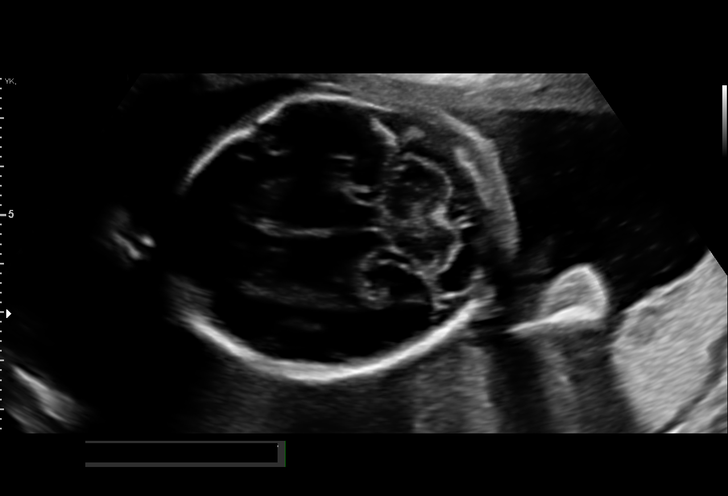
[im 13/111]
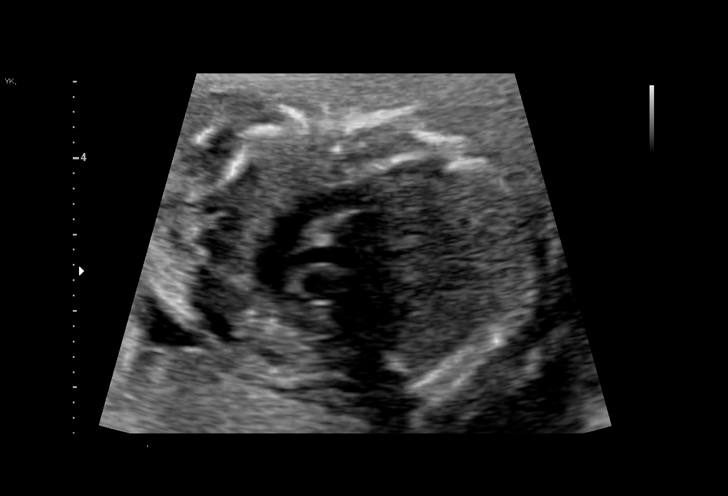
[im 21/111]
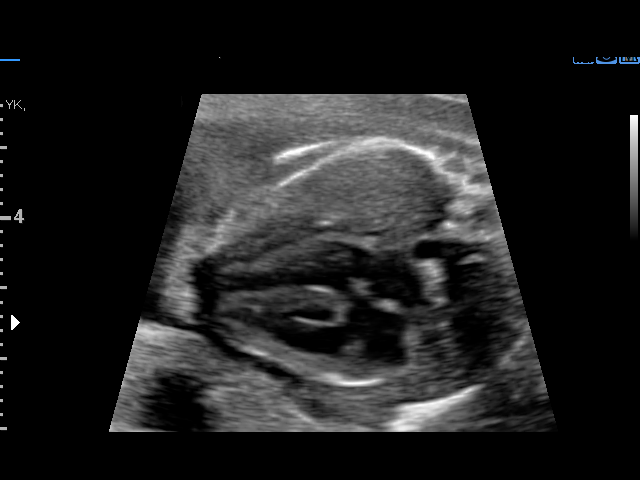
[im 29/111]
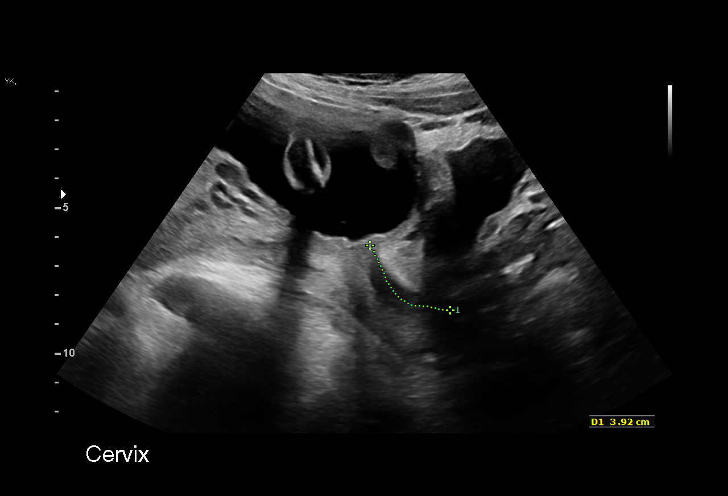
[im 37/111]
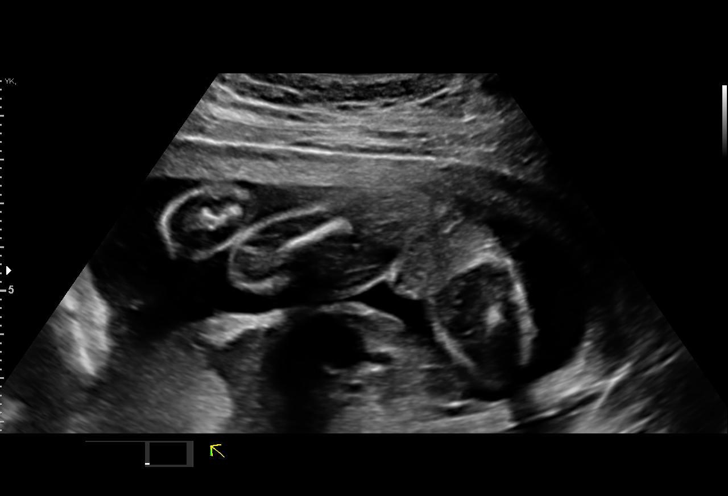
[im 45/111]
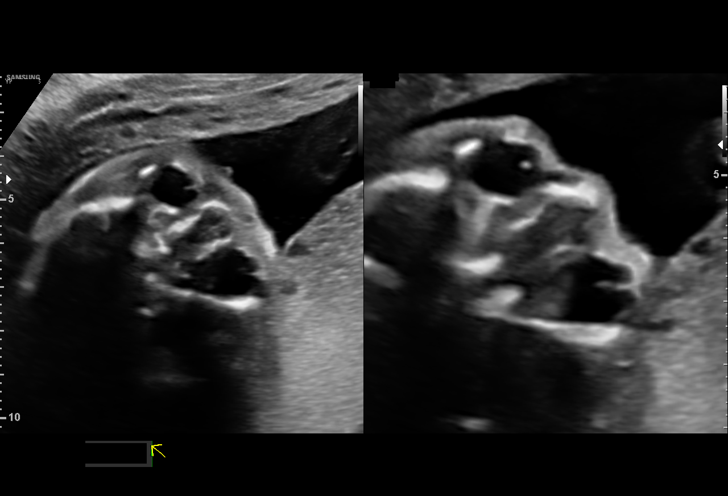
[im 53/111]
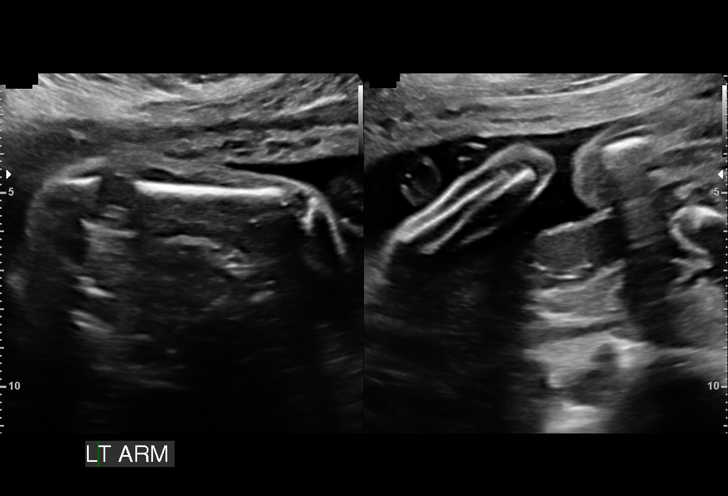
[im 62/111]
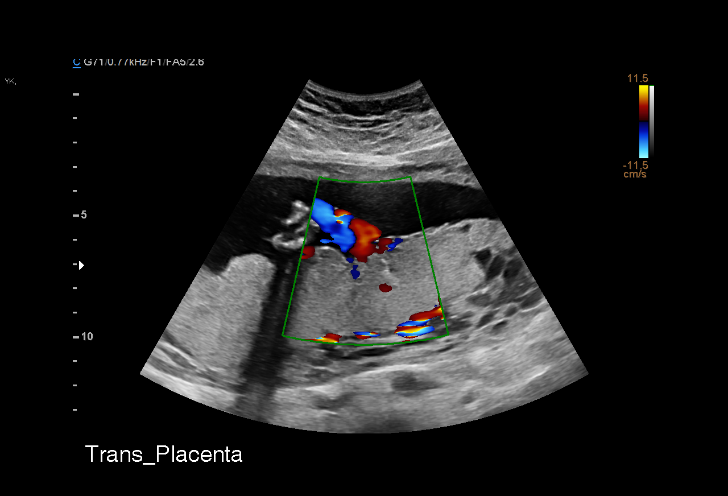
[im 70/111]
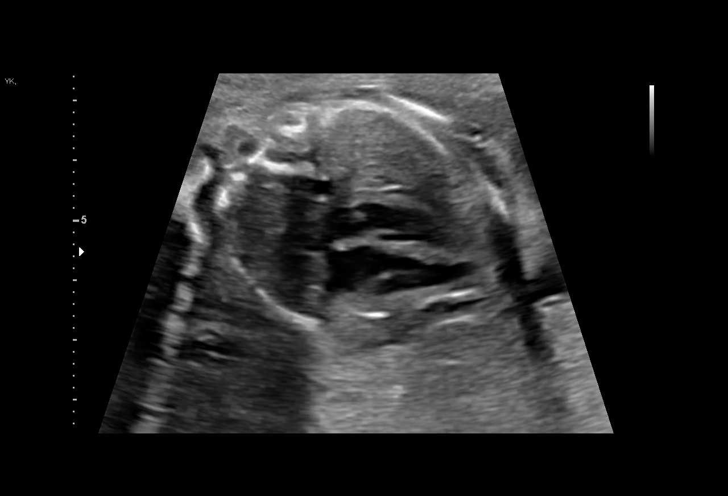
[im 78/111]
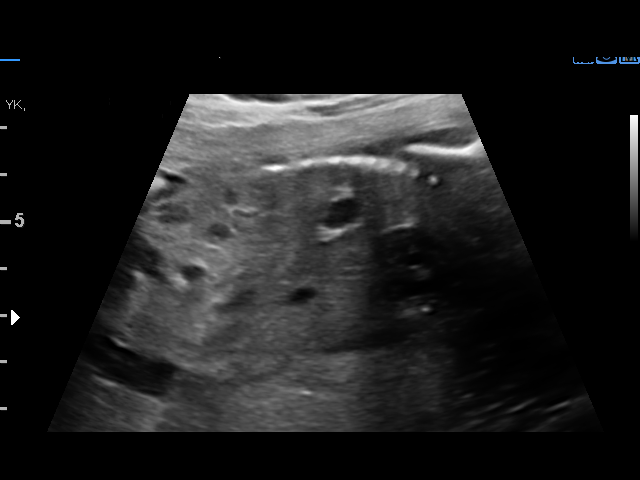
[im 86/111]
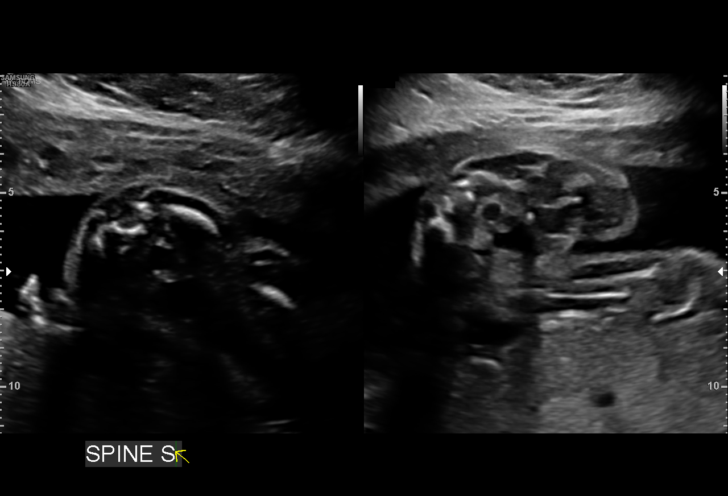
[im 94/111]
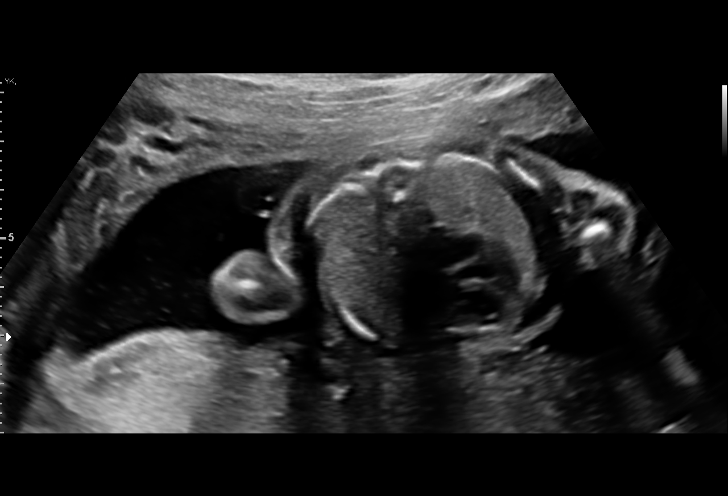
[im 102/111]
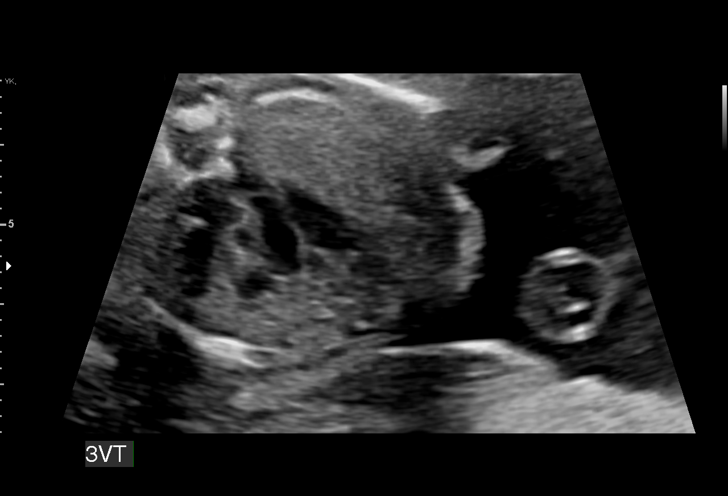
[im 111/111]
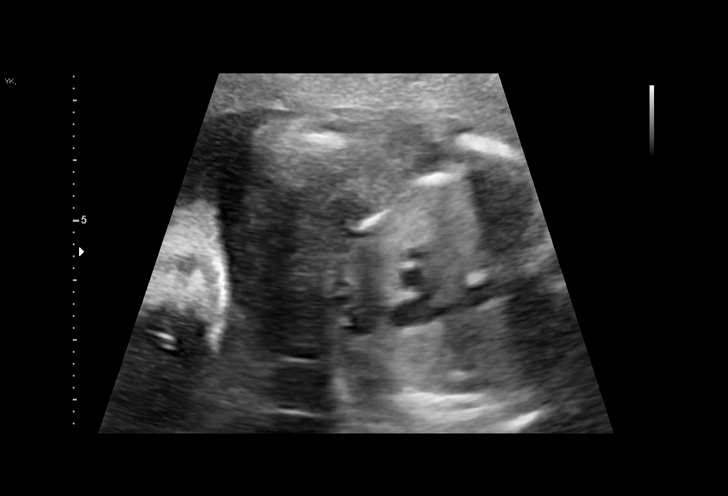

[14 of 28 positions shown; findings below may reference images not displayed]

Indications

 24 weeks gestation of pregnancy
 Tetra: neg
 Encounter for antenatal screening for
 malformations
 Tobacco use complicating pregnancy, second
 trimester
Vital Signs

 BMI:
Fetal Evaluation

 Num Of Fetuses:          1
 Fetal Heart Rate(bpm):   137
 Cardiac Activity:        Observed
 Presentation:            Variable
 Placenta:                Posterior
 P. Cord Insertion:       Visualized, central

 Amniotic Fluid
 AFI FV:      Within normal limits

                             Largest Pocket(cm)

Biometry

 BPD:      57.7   mm     G. Age:  23w 5d         26  %    CI:         74.25  %    70 - 86
                                                          FL/HC:       19.1  %    18.7 -
 HC:      212.6   mm     G. Age:  23w 2d          9  %    HC/AC:       1.10       1.05 -
 AC:      193.6   mm     G. Age:  24w 0d         38  %    FL/BPD:      70.4  %    71 - 87
 FL:       40.6   mm     G. Age:  23w 1d         12  %    FL/AC:       21.0  %    20 - 24
 CER:      25.6   mm     G. Age:  23w 4d         36  %
 NFT:        0.0  mm

 CM:         5.2  mm

 Est. FW:     613   gm      1 lb 6 oz     20  %
OB History

 Gravidity:     1
Gestational Age

 LMP:            24w 1d       Date:  11/17/19                   EDD:  08/23/20
 U/S Today:      23w 4d                                         EDD:  08/27/20
 Best:           24w 1d    Det. By:  LMP  (11/17/19)            EDD:  08/23/20
Anatomy

 Cranium:                Appears normal         LVOT:                   Appears normal
 Cavum:                  Appears normal         Aortic Arch:            Appears normal
 Ventricles:             Appears normal         Ductal Arch:            Appears normal
 Choroid Plexus:         Appears normal         Diaphragm:              Appears normal
 Cerebellum:             Appears normal         Stomach:                Appears normal, left
                                                                        sided
 Posterior Fossa:        Appears normal         Abdomen:                Appears normal
 Nuchal Fold:            Not applicable (>20    Abdominal Wall:         Appears nml (cord
                         wks GA)                                        insert, abd wall)
 Face:                   Appears normal         Cord Vessels:           Appears normal (3
                         (orbits and profile)                           vessel cord)
 Lips:                   Appears normal         Kidneys:                Appear normal
 Palate:                 Appears normal         Bladder:                Appears normal
 Thoracic:               Appears normal         Spine:                  Appears normal
 Heart:                  Appears normal         Upper Extremities:      Appears normal
                         (4CH, axis, and situs)
 RVOT:                   Appears normal         Lower Extremities:      Appears normal

 Other:   Fetus appears to be female. Heels visualized. Hands and feet visualized.
          Nasal bone visualized.
Cervix Uterus Adnexa

 Cervix
 Length:             3.9  cm.
 Normal appearance by transabdominal scan.

 Uterus
 No abnormality visualized.
 Right Ovary
 Within normal limits.

 Left Ovary
 Within normal limits.

 Cul De Sac
 No free fluid seen.

 Adnexa
 No abnormality visualized.
Impression

 Normal anatomy with measurement consistent with dates
 Good fetal movement and amniotic fluid
Recommendations

 Follow up as clinically indicated

## 2024-07-20 NOTE — Progress Notes (Signed)
 Subjective Isabel Chang is a 23 y.o. female who presents for Otalgia (Both ears x2 days) History of Present Illness This is a 23 year old female presenting with symptoms of an ear infection.  The patient reports experiencing significant throat pain and mild ear discomfort. She notes that her ear pain becomes intense, prompting her to use ear drops for relief. The onset of her symptoms coincided with a recent severe cold, which she believes may have caused her current condition. She experienced fever and chills during the cold, which have mostly resolved. However, she felt slightly feverish yesterday while working, attributing it to overexertion. She has been dealing with nasal congestion, which has improved since this morning with Advil  and allergy medications. Upon waking, her throat is swollen to the point where it hinders her ability to eat. She has not had a follow-up with an ENT recently and has not taken antibiotics for her ears in a long time. She plans to consult her primary care physician, who also treats her fianc. She has previously taken Augmentin in pill form and tolerated it well.  Past Medical History:  Diagnosis Date  . 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 Adenoidectomy    . Tympanostomy tube placement Bilateral    x4    Family History  Problem Relation Age of Onset  . Hypertension Mother   . Alcohol abuse Father   . Skin cancer Maternal Grandmother   . Ovarian cancer Maternal Grandmother   . Hypertension Maternal Grandfather   . Sleep apnea Maternal Grandfather   . No Known Problems Paternal Grandmother   . No Known Problems Paternal Grandfather     Social History[2]   Review of Systems Review of Systems - All systems reviewed and are negative except what is noted in the HPI.    Objective Blood pressure 124/90, pulse 90, temperature 98.5 F (36.9 C), temperature source Oral,  resp. rate 18, height 5' 2 (1.575 m), weight 142 lb 3.2 oz (64.5 kg), last menstrual period 07/07/2024, SpO2 98%, not currently breastfeeding. Physical Exam ENT: Fluid noted behind the left ear, which appears slightly pink. The right ear also shows some fluid. Drainage observed in the throat. Respiratory: Lungs auscultated. Physical Exam Vitals and nursing note reviewed.  Constitutional:      General: She is not in acute distress.    Appearance: Normal appearance. She is not ill-appearing or toxic-appearing.  HENT:     Head: Normocephalic and atraumatic.     Right Ear: Ear canal and external ear normal. There is no impacted cerumen.     Left Ear: Ear canal and external ear normal. There is no impacted cerumen.     Ears:     Comments: Left TM noted to have effusion and erythema.  TM is intact.  Serous effusion noted also on the right.    Nose: Congestion present. No rhinorrhea.     Mouth/Throat:     Mouth: Mucous membranes are moist.     Pharynx: No oropharyngeal exudate or posterior oropharyngeal erythema.     Comments: Mild postnasal drip noted Eyes:     General: No scleral icterus.    Conjunctiva/sclera: Conjunctivae normal.  Cardiovascular:     Rate and Rhythm: Normal rate and regular rhythm.  Pulmonary:     Effort: Pulmonary effort is normal. No respiratory distress.     Breath sounds: Normal breath sounds.  Abdominal:     General: There  is no distension.  Musculoskeletal:        General: Normal range of motion.     Cervical back: Normal range of motion and neck supple. No rigidity.  Skin:    General: Skin is warm and dry.     Findings: No rash.  Neurological:     General: No focal deficit present.     Mental Status: She is alert and oriented to person, place, and time.  Psychiatric:        Mood and Affect: Mood normal.        Behavior: Behavior normal.        Results  Laboratory Testing No results found for this or any previous visit (from the past 250  hours).    1. Acute bacterial infection of left middle ear      2. Bilateral chronic serous otitis media      3. Allergic rhinitis, unspecified seasonality, unspecified trigger        Assessment & Plan Initial Assessment: 23 year old female with ear pain, throat pain, and fluid behind the ears. Recent history of a bad cold.  Differential Diagnosis: - Ear infection: Symptoms include ear pain, fluid behind ears. Plan: Prescribe Augmentin, recommend decongestants, maintain hydration. - Throat pain: Associated with ear infection. Plan: Advise taking medication with food to prevent stomach upset.  Urgent Care Course: - Reviewed medical records and audiology appointments. - Examined ears: Fluid and pinkness noted. - Examined throat: Drainage noted. - Discussed symptoms and history of cold. - Recommended Augmentin for ear infection. - Suggested decongestants for fluid management. - Advised maintaining hydration.  Final Assessment: Reviewed medical records, examined ears and throat, prescribed Augmentin, recommended decongestants, advised hydration.  Clinical Impression: Acute bacterial otitis media on the left with with bilateral serous otitis media - Throat pain  Disposition: - Follow-Up: ENT referral recommended. Consider PCP for further management.  Patient Education: Take Augmentin with food to prevent stomach upset. Maintain hydration. Follow up with ENT.     Patient's Medications       * Accurate as of July 20, 2024 11:59 PM. Reflects encounter med changes as of last refresh          New Prescriptions      Instructions  amoxicillin-clavulanate 875-125 mg per tablet Commonly known as: AUGMENTIN  1 tablet, Oral, 2 times a day       Continued Medications      Instructions  albuterol sulfate HFA 108 (90 Base) MCG/ACT inhaler Commonly known as: PROVENTIL,VENTOLIN,PROAIR  2 puffs, Inhalation, Every 6 hours as needed   ibuprofen  800 mg tablet Commonly  known as: ADVIL ,MOTRIN   800 mg, Oral, Every 8 hours as needed   lamoTRIgine  25 mg tablet Commonly known as: LAMICTAL   25 mg, Oral, Daily         Advised patient to follow up with their PCP or RTC this week if condition persists or if symptoms worsen go to the emergency department for evaluation.  Findings and plan of care discussed with the patient/family members.  Lab study results, if obtained today, discussed with patient/family. .   Written and verbal instructions given for pt's diagnosis/es as well as recommendations for symptom relief and follow up. If pt is having pain and no contraindications, advised taking OTC acetaminophen  and/or ibuprofen  as needed.  Increase fluid intake if directed for hydration. I reviewed the patient instructions on the AVS with the patient/family who convey/s understanding and is/are in agreement with plan of care. The patient/family voice/s understanding of  all prescriptions, if written. No barriers to adherence were noted and they convey understanding.  Plan for follow-up as discussed or as needed if any worsening symptoms or change in condition. If chronic conditions noted, the pt is advised to follow up with their PCP. Strict ED precautions given and discussed. All Questions are answered and the patient/family conveys understanding.  After Visit Summary was given to patient and/or sent to MyChart.   Julienne A Summerlin, PA   [employer] was used to create visit note. Consent from the patient/caregiver was obtained prior to its use.       [1] No Known Allergies [2] Social History Socioeconomic History  . Marital status: Other  Tobacco Use  . Smoking status: Never    Passive exposure: Never  . Smokeless tobacco: Never  . Tobacco comments:    vapes  Vaping Use  . Vaping status: Every Day  . Substances: Nicotine, THC  Substance and Sexual Activity  . Alcohol use: No  . Drug use: No  . Sexual activity: Never   *Some images could not be shown.

## 2024-09-15 ENCOUNTER — Emergency Department (HOSPITAL_BASED_OUTPATIENT_CLINIC_OR_DEPARTMENT_OTHER)

## 2024-09-15 ENCOUNTER — Encounter (HOSPITAL_BASED_OUTPATIENT_CLINIC_OR_DEPARTMENT_OTHER): Payer: Self-pay

## 2024-09-15 ENCOUNTER — Other Ambulatory Visit: Payer: Self-pay

## 2024-09-15 ENCOUNTER — Observation Stay (HOSPITAL_BASED_OUTPATIENT_CLINIC_OR_DEPARTMENT_OTHER)
Admission: EM | Admit: 2024-09-15 | Discharge: 2024-09-16 | Disposition: A | Discharge status: Home / Self Care | Attending: Internal Medicine | Admitting: Internal Medicine

## 2024-09-15 DIAGNOSIS — R112 Nausea with vomiting, unspecified: Secondary | ICD-10-CM | POA: Diagnosis present

## 2024-09-15 DIAGNOSIS — F319 Bipolar disorder, unspecified: Secondary | ICD-10-CM | POA: Diagnosis present

## 2024-09-15 DIAGNOSIS — R7401 Elevation of levels of liver transaminase levels: Secondary | ICD-10-CM | POA: Diagnosis not present

## 2024-09-15 DIAGNOSIS — F17210 Nicotine dependence, cigarettes, uncomplicated: Secondary | ICD-10-CM | POA: Diagnosis not present

## 2024-09-15 DIAGNOSIS — F419 Anxiety disorder, unspecified: Secondary | ICD-10-CM | POA: Insufficient documentation

## 2024-09-15 DIAGNOSIS — F1290 Cannabis use, unspecified, uncomplicated: Secondary | ICD-10-CM | POA: Diagnosis present

## 2024-09-15 DIAGNOSIS — K529 Noninfective gastroenteritis and colitis, unspecified: Secondary | ICD-10-CM | POA: Diagnosis not present

## 2024-09-15 DIAGNOSIS — F12920 Cannabis use, unspecified with intoxication, uncomplicated: Secondary | ICD-10-CM | POA: Diagnosis not present

## 2024-09-15 DIAGNOSIS — R7989 Other specified abnormal findings of blood chemistry: Secondary | ICD-10-CM

## 2024-09-15 LAB — COMPREHENSIVE METABOLIC PANEL WITH GFR
ALT: 96 U/L — ABNORMAL HIGH (ref 0–44)
AST: 65 U/L — ABNORMAL HIGH (ref 15–41)
Albumin: 4.9 g/dL (ref 3.5–5.0)
Alkaline Phosphatase: 69 U/L (ref 38–126)
Anion gap: 15 (ref 5–15)
BUN: 10 mg/dL (ref 6–20)
CO2: 22 mmol/L (ref 22–32)
Calcium: 9.8 mg/dL (ref 8.9–10.3)
Chloride: 103 mmol/L (ref 98–111)
Creatinine, Ser: 0.77 mg/dL (ref 0.44–1.00)
GFR, Estimated: >60 mL/min (ref >60)
Glucose, Bld: 130 mg/dL — ABNORMAL HIGH (ref 70–99)
Potassium: 4.3 mmol/L (ref 3.5–5.1)
Sodium: 140 mmol/L (ref 135–145)
Total Bilirubin: 0.5 mg/dL (ref 0.0–1.2)
Total Protein: 7.8 g/dL (ref 6.5–8.1)

## 2024-09-15 LAB — CBC
HCT: 41.1 % (ref 36.0–46.0)
Hemoglobin: 14.2 g/dL (ref 12.0–15.0)
MCH: 29.3 pg (ref 26.0–34.0)
MCHC: 34.5 g/dL (ref 30.0–36.0)
MCV: 84.7 fL (ref 80.0–100.0)
Platelets: 219 K/uL (ref 150–400)
RBC: 4.85 MIL/uL (ref 3.87–5.11)
RDW: 12.1 % (ref 11.5–15.5)
WBC: 11.4 K/uL — ABNORMAL HIGH (ref 4.0–10.5)
nRBC: 0 % (ref 0.0–0.2)

## 2024-09-15 LAB — LIPASE, BLOOD: Lipase: 212 U/L — ABNORMAL HIGH (ref 11–51)

## 2024-09-15 LAB — HCG, SERUM, QUALITATIVE: Preg, Serum: NEGATIVE

## 2024-09-15 LAB — RESP PANEL BY RT-PCR (RSV, FLU A&B, COVID)  RVPGX2
Influenza A by PCR: NEGATIVE
Influenza B by PCR: NEGATIVE
Resp Syncytial Virus by PCR: NEGATIVE
SARS Coronavirus 2 by RT PCR: NEGATIVE

## 2024-09-15 MED ORDER — SODIUM CHLORIDE 0.9 % IV BOLUS
1000.0000 mL | Freq: Once | INTRAVENOUS | Status: AC
  Administered 2024-09-15: 1000 mL via INTRAVENOUS

## 2024-09-15 MED ORDER — ONDANSETRON HCL 4 MG/2ML IJ SOLN
4.0000 mg | Freq: Once | INTRAMUSCULAR | Status: AC | PRN
  Administered 2024-09-15: 4 mg via INTRAVENOUS
  Filled 2024-09-15: qty 2

## 2024-09-15 MED ORDER — ONDANSETRON HCL 4 MG/2ML IJ SOLN
4.0000 mg | Freq: Once | INTRAMUSCULAR | Status: AC
  Administered 2024-09-15: 4 mg via INTRAVENOUS
  Filled 2024-09-15: qty 2

## 2024-09-15 MED ORDER — IOHEXOL 300 MG/ML  SOLN
100.0000 mL | Freq: Once | INTRAMUSCULAR | Status: AC | PRN
  Administered 2024-09-15: 100 mL via INTRAVENOUS

## 2024-09-15 NOTE — ED Triage Notes (Signed)
 Pt reports vomiting and not being able to keep food/fluids down for past 24 hours. Pt reports chills/denies fever. Mild diarrhea. Pt awake and alert.

## 2024-09-15 NOTE — ED Provider Notes (Signed)
 [place] [facility] AT [facility] Provider Note   CSN: [id number] Arrival date & time: 09/15/24  1916     Patient presents with: Vomiting   Isabel Chang is a 23 y.o. female.   HPI   Patient presents because of nausea and vomiting.  Patient states that she has had a cough over the past couple days.  No chest pain or pleuritic chest pain.  No hemoptysis.  Just a nonproductive cough.  Today started having episodes of nausea vomiting and diarrhea.  Had a couple episodes of diarrhea earlier today but then had repeat episodes of vomiting today.  No previous abdominal surgeries.  No hematochezia or hematemesis.  Patient states that her fianc's other facility was sick with a cough but no GI illness. Endorses generalized abdominal pain.     Prior to Admission medications   Medication Sig Start Date End Date Taking? Authorizing Provider  acetaminophen  (TYLENOL ) 325 MG tablet Take 650 mg by mouth every 6 (six) hours as needed for mild pain.    [provider]  hydrOXYzine  (ATARAX /VISTARIL ) 25 MG tablet Take 1 tablet (25 mg total) by mouth every 6 (six) hours as needed for anxiety. 08/25/20   Ozan, Jennifer, DO  ibuprofen  (ADVIL ) 600 MG tablet Take 1 tablet (600 mg total) by mouth every 6 (six) hours as needed for headache, mild pain, moderate pain or cramping. 08/25/20   Ozan, Jennifer, DO  lamoTRIgine  (LAMICTAL ) 100 MG tablet Take 100 mg by mouth daily. 06/18/20   [provider]  Prenatal Vit-Fe Fumarate-FA (PRENATAL VITAMINS PO) Take 1 tablet by mouth daily.     [provider]    Allergies: Patient has no known allergies.    Review of Systems  Constitutional:  Negative for chills and fever.  HENT:  Negative for ear pain and sore throat.   Eyes:  Negative for pain and visual disturbance.  Respiratory:  Negative for cough and shortness of breath.   Cardiovascular:  Negative for chest pain and palpitations.  Gastrointestinal:  Negative  for abdominal pain and vomiting.  Genitourinary:  Negative for dysuria and hematuria.  Musculoskeletal:  Negative for arthralgias and back pain.  Skin:  Negative for color change and rash.  Neurological:  Negative for seizures and syncope.  All other systems reviewed and are negative.   Updated Vital Signs BP (!) 120/104   Pulse 78   Temp 97.8 F (36.6 C) (Oral)   Resp (!) 24   Ht 5' 2 (1.575 m)   Wt 63.5 kg   SpO2 100%   BMI 25.61 kg/m   Physical Exam Vitals and nursing note reviewed.  Constitutional:      General: She is not in acute distress.    Appearance: She is well-developed.  HENT:     Head: Normocephalic and atraumatic.  Eyes:     Conjunctiva/sclera: Conjunctivae normal.  Cardiovascular:     Rate and Rhythm: Normal rate and regular rhythm.     Heart sounds: No murmur heard. Pulmonary:     Effort: Pulmonary effort is normal. No respiratory distress.     Breath sounds: Normal breath sounds.  Abdominal:     Palpations: Abdomen is soft.     Tenderness: There is no abdominal tenderness.  Musculoskeletal:        General: No swelling.     Cervical back: Neck supple.  Skin:    General: Skin is warm and dry.     Capillary Refill: Capillary refill takes less  than 2 seconds.  Neurological:     Mental Status: She is alert.  Psychiatric:        Mood and Affect: Mood normal.     (all labs ordered are listed, but only abnormal results are displayed) Labs Reviewed  LIPASE, BLOOD - Abnormal; Notable for the following components:      Result Value   Lipase 212 (*)    All other components within normal limits  COMPREHENSIVE METABOLIC PANEL WITH GFR - Abnormal; Notable for the following components:   Glucose, Bld 130 (*)    AST 65 (*)    ALT 96 (*)    All other components within normal limits  CBC - Abnormal; Notable for the following components:   WBC 11.4 (*)    All other components within normal limits  RESP PANEL BY RT-PCR (RSV, FLU A&B, COVID)  [id number]   GASTROINTESTINAL PANEL BY PCR, STOOL (REPLACES STOOL CULTURE)  HCG, SERUM, QUALITATIVE  URINALYSIS, ROUTINE W REFLEX MICROSCOPIC    EKG: EKG Interpretation Date/Time:  Monday September 15 2024 22:19:54 EDT Ventricular Rate:  76 PR Interval:  125 QRS Duration:  92 QT Interval:  393 QTC Calculation: 442 R Axis:   63  Text Interpretation: Sinus rhythm Confirmed by Simon Rea [phone]) on 09/15/2024 11:51:23 PM  Radiology: [facility] Chest Portable 1 View Result Date: 09/15/2024 EXAM: 1 VIEW(S) XRAY OF THE CHEST 09/15/2024 10:41:00 PM COMPARISON: None available. CLINICAL HISTORY: Pt reports vomiting and not being able to keep food/fluids down for past 24 hours. Pt reports chills. FINDINGS: LUNGS AND PLEURA: No focal pulmonary opacity. No pulmonary edema. No pleural effusion. No pneumothorax. HEART AND MEDIASTINUM: No acute abnormality of the cardiac and mediastinal silhouettes. BONES AND SOFT TISSUES: No acute osseous abnormality. IMPRESSION: 1. No acute abnormalities. Electronically signed by: Andrea Gasman MD 09/15/2024 10:59 PM EDT RP Workstation: [id number]   CT ABDOMEN PELVIS W CONTRAST Result Date: 09/15/2024 EXAM: CT ABDOMEN AND PELVIS WITH CONTRAST 09/15/2024 10:34:58 PM TECHNIQUE: CT of the abdomen and pelvis was performed with the administration of of intravenous iohexol (OMNIPAQUE) 300 MG/ML solution. Multiplanar reformatted images are provided for review. Automated exposure control, iterative reconstruction, and/or weight-based adjustment of the mA/kV was utilized to reduce the radiation dose to as low as reasonably achievable. COMPARISON: None available. CLINICAL HISTORY: Pain epigastric area with n/v and elevated lipase. Emesis. Pt reports emesis x 24 hours with chills and mild diarrhea. FINDINGS: LOWER CHEST: No acute abnormality. LIVER: The liver is unremarkable. GALLBLADDER AND BILE DUCTS: Gallbladder is unremarkable. No biliary ductal dilatation. SPLEEN: No acute abnormality.  PANCREAS: No acute abnormality. ADRENAL GLANDS: No acute abnormality. KIDNEYS, URETERS AND BLADDER: No stones in the kidneys or ureters. No hydronephrosis. No perinephric or periureteral stranding. Urinary bladder is unremarkable. GI AND BOWEL: Stomach demonstrates no acute abnormality. There is no bowel obstruction. PERITONEUM AND RETROPERITONEUM: No ascites. No free air. VASCULATURE: Aorta is normal in caliber. LYMPH NODES: No lymphadenopathy. REPRODUCTIVE ORGANS: No acute abnormality. BONES AND SOFT TISSUES: No acute osseous abnormality. No focal soft tissue abnormality. IMPRESSION: 1. No acute findings in the abdomen or pelvis. Electronically signed by: Norman Gatlin MD 09/15/2024 10:56 PM EDT RP Workstation: [id number]     Procedures   Medications Ordered in the ED  ondansetron  (ZOFRAN ) injection 4 mg (4 mg Intravenous Given 09/15/24 2004)  sodium chloride  0.9 % bolus 1,000 mL (1,000 mLs Intravenous New Bag/Given 09/15/24 2208)  sodium chloride  0.9 % bolus 1,000 mL (1,000 mLs Intravenous New Bag/Given 09/15/24 2208)  ondansetron  (ZOFRAN ) injection 4 mg (4 mg Intravenous Given 09/15/24 2208)  iohexol (OMNIPAQUE) 300 MG/ML solution 100 mL (100 mLs Intravenous Contrast Given 09/15/24 2228)    Clinical Course as of 09/15/24 2352  Mon Sep 15, 2024  2310 CT ABDOMEN PELVIS W CONTRAST [TL]    Clinical Course User Index [TL] Simon Lavonia SAILOR, MD                                 Medical Decision Making Amount and/or Complexity of Data Reviewed Labs: ordered. Radiology: ordered. Decision-making details documented in ED Course.  Risk Prescription drug management.    Patient presents because of nausea and vomiting.  Patient states that she has had a cough over the past couple days.  No chest pain or pleuritic chest pain.  No hemoptysis.  Just a nonproductive cough.  Today started having episodes of nausea vomiting and diarrhea.  Had a couple episodes of diarrhea earlier today but then had repeat  episodes of vomiting today.  No previous abdominal surgeries.  No hematochezia or hematemesis.  Patient states that her fianc's other facility was sick with a cough but no GI illness. Endorses generalized abdominal pain.   Upon exam, patient hemodynamically stable.  Was tachycardic on triage but heart rate in the 90s on my exam.  Maps appropriate  Soft and benign abdomen.  No significant rebound or guarding back appreciated.  Generalized tenderness.   Initially slightly tachycardic.  Responded well to fluids.  Laboratory workup shows elevated LFTs.  Likely reactive.  Elevated lipase.  Bilirubin normal.  Alk phos normal.  Given these lab derangements, did obtain CT scan of the abdomen.  This was benign.  No CBD ductal dilation.  COVID RSV and flu negative.  EKG shows no QTc prolongation.  Patient received multiple rounds of Zofran  here in the ED.  I do think this is likely ongoing viral or foodborne illness.  Unable to tolerate p.o.  Continuous emesis.  Therefore, will need to be admitted for observation overnight.     Final diagnoses:  Nausea and vomiting, unspecified vomiting type  LFT elevation    ED Discharge Orders     None          Simon Lavonia SAILOR, MD 09/15/24 2352

## 2024-09-16 DIAGNOSIS — R112 Nausea with vomiting, unspecified: Secondary | ICD-10-CM | POA: Diagnosis present

## 2024-09-16 DIAGNOSIS — K529 Noninfective gastroenteritis and colitis, unspecified: Principal | ICD-10-CM | POA: Diagnosis present

## 2024-09-16 DIAGNOSIS — F319 Bipolar disorder, unspecified: Secondary | ICD-10-CM | POA: Diagnosis present

## 2024-09-16 DIAGNOSIS — F1290 Cannabis use, unspecified, uncomplicated: Secondary | ICD-10-CM | POA: Diagnosis present

## 2024-09-16 DIAGNOSIS — R1111 Vomiting without nausea: Secondary | ICD-10-CM | POA: Diagnosis not present

## 2024-09-16 DIAGNOSIS — R7989 Other specified abnormal findings of blood chemistry: Secondary | ICD-10-CM | POA: Diagnosis present

## 2024-09-16 DIAGNOSIS — Z743 Need for continuous supervision: Secondary | ICD-10-CM | POA: Diagnosis not present

## 2024-09-16 DIAGNOSIS — R7401 Elevation of levels of liver transaminase levels: Secondary | ICD-10-CM | POA: Diagnosis present

## 2024-09-16 LAB — URINE DRUG SCREEN
Amphetamines: NEGATIVE
Barbiturates: NEGATIVE
Benzodiazepines: NEGATIVE
Cocaine: NEGATIVE
Fentanyl: NEGATIVE
Methadone Scn, Ur: NEGATIVE
Opiates: NEGATIVE
Tetrahydrocannabinol: POSITIVE — AB

## 2024-09-16 LAB — URINALYSIS, ROUTINE W REFLEX MICROSCOPIC
Bilirubin Urine: NEGATIVE
Glucose, UA: NEGATIVE mg/dL
Hgb urine dipstick: NEGATIVE
Ketones, ur: 20 mg/dL — AB
Leukocytes,Ua: NEGATIVE
Nitrite: NEGATIVE
Protein, ur: NEGATIVE mg/dL
Specific Gravity, Urine: 1.042 — ABNORMAL HIGH (ref 1.005–1.030)
pH: 5 (ref 5.0–8.0)

## 2024-09-16 LAB — RESPIRATORY PANEL BY PCR

## 2024-09-16 LAB — COMPREHENSIVE METABOLIC PANEL WITH GFR
ALT: 65 U/L — ABNORMAL HIGH (ref 0–44)
AST: 32 U/L (ref 15–41)
Albumin: 4.4 g/dL (ref 3.5–5.0)
Alkaline Phosphatase: 58 U/L (ref 38–126)
Anion gap: 14 (ref 5–15)
BUN: 9 mg/dL (ref 6–20)
CO2: 21 mmol/L — ABNORMAL LOW (ref 22–32)
Calcium: 8.8 mg/dL — ABNORMAL LOW (ref 8.9–10.3)
Chloride: 103 mmol/L (ref 98–111)
Creatinine, Ser: 0.69 mg/dL (ref 0.44–1.00)
GFR, Estimated: >60 mL/min (ref >60)
Glucose, Bld: 79 mg/dL (ref 70–99)
Potassium: 3.5 mmol/L (ref 3.5–5.1)
Sodium: 138 mmol/L (ref 135–145)
Total Bilirubin: 0.4 mg/dL (ref 0.0–1.2)
Total Protein: 6.9 g/dL (ref 6.5–8.1)

## 2024-09-16 LAB — ETHANOL: Alcohol, Ethyl (B): <15 mg/dL (ref <15)

## 2024-09-16 LAB — MAGNESIUM: Magnesium: 1.9 mg/dL (ref 1.7–2.4)

## 2024-09-16 LAB — CBC
HCT: 37.9 % (ref 36.0–46.0)
Hemoglobin: 12 g/dL (ref 12.0–15.0)
MCH: 27.9 pg (ref 26.0–34.0)
MCHC: 31.7 g/dL (ref 30.0–36.0)
MCV: 88.1 fL (ref 80.0–100.0)
Platelets: 194 K/uL (ref 150–400)
RBC: 4.3 MIL/uL (ref 3.87–5.11)
RDW: 12.4 % (ref 11.5–15.5)
WBC: 6.8 K/uL (ref 4.0–10.5)
nRBC: 0 % (ref 0.0–0.2)

## 2024-09-16 LAB — TSH: TSH: 0.867 u[IU]/mL (ref 0.350–4.500)

## 2024-09-16 LAB — MONONUCLEOSIS SCREEN: Mono Screen: NEGATIVE

## 2024-09-16 LAB — HIV ANTIBODY (ROUTINE TESTING W REFLEX): HIV Screen 4th Generation wRfx: NONREACTIVE

## 2024-09-16 MED ORDER — HYDROXYZINE HCL 25 MG PO TABS: 25.0000 mg | ORAL_TABLET | Freq: Four times a day (QID) | ORAL | Status: DC | PRN

## 2024-09-16 MED ORDER — ONDANSETRON HCL 4 MG PO TABS: 4.0000 mg | ORAL_TABLET | Freq: Four times a day (QID) | ORAL | 0 refills | Status: AC | PRN

## 2024-09-16 MED ORDER — HYDRALAZINE HCL 20 MG/ML IJ SOLN: 10.0000 mg | Freq: Four times a day (QID) | INTRAMUSCULAR | Status: DC | PRN

## 2024-09-16 MED ORDER — ALBUTEROL SULFATE (2.5 MG/3ML) 0.083% IN NEBU: 2.5000 mg | INHALATION_SOLUTION | RESPIRATORY_TRACT | Status: DC | PRN

## 2024-09-16 MED ORDER — PROCHLORPERAZINE EDISYLATE 10 MG/2ML IJ SOLN
10.0000 mg | Freq: Once | INTRAMUSCULAR | Status: AC
  Administered 2024-09-16: 10 mg via INTRAVENOUS
  Filled 2024-09-16: qty 2

## 2024-09-16 MED ORDER — SODIUM CHLORIDE 0.9 % IV SOLN: INTRAVENOUS | Status: DC

## 2024-09-16 MED ORDER — ONDANSETRON HCL 4 MG PO TABS: 4.0000 mg | ORAL_TABLET | Freq: Four times a day (QID) | ORAL | Status: DC | PRN

## 2024-09-16 MED ORDER — HEPARIN SODIUM (PORCINE) 5000 UNIT/ML IJ SOLN
5000.0000 [IU] | Freq: Three times a day (TID) | INTRAMUSCULAR | Status: DC
  Administered 2024-09-16: 5000 [IU] via SUBCUTANEOUS
  Filled 2024-09-16: qty 1

## 2024-09-16 MED ORDER — ONDANSETRON HCL 4 MG/2ML IJ SOLN: 4.0000 mg | Freq: Four times a day (QID) | INTRAMUSCULAR | Status: DC | PRN

## 2024-09-16 NOTE — Hospital Course (Signed)
 23yo with h/o bipolar d/o and chronic back pain who presented with n/v.  Concern for gastroenteritis.

## 2024-09-16 NOTE — Progress Notes (Signed)
   09/16/24 0839  TOC Brief Assessment  Insurance and Status Reviewed  Patient has primary care physician No  Home environment has been reviewed Apartment  Prior level of function: Independent  Prior/Current Home Services No current home services  Social Drivers of Health Review SDOH reviewed no interventions necessary  Readmission risk has been reviewed Yes  Transition of care needs no transition of care needs at this time    Signed: Heather Saltness, MSW, LCSW Clinical Social Worker Inpatient Care Management 09/16/2024 8:40 AM

## 2024-09-16 NOTE — Progress Notes (Signed)
Discharge instructions given to patient no questions at this time.

## 2024-09-16 NOTE — ED Notes (Signed)
 Report called over to [employer] and given to Hissop

## 2024-09-16 NOTE — H&P (Signed)
 History and Physical    Isabel Chang FMW:[id number] DOB: November 01, 2001 DOA: 09/15/2024  PCP: Pcp, No  Patient coming from: [facility]  I have personally briefly reviewed patient's old medical records in [facility]  Chief Complaint: n/v  HPI: Isabel Chang is a 23 y.o. female with medical history significant of Anxiety, Bipolar d/o, Depresison, Chronic back pain, who presents to [facility] with complaint of  n/v x 24 hours. Per patient  also had associated diarrhea. She does note sick contact in her significant other. She also state she has respiratory symptoms for which she took over the counter medicine prior to onset of symptoms. Patient currently states s/p treatment in ED she feels improved. She states that she has sips of liquid and was able to keep it down.She currently notes no abdominal pain and no return of n/v/d since admit to floor.    ED Course:  Vitals:   Txm 100.1 bp 144/82, hr 102, rr 20 , sat !00% Wbc 11.4, plt 219, hgb 14.2  Lipase 212 Na 140, K 4.3, Clk 102, glu 130, cr0.77 ast 65, alt 96 RVP: neg CTAB NAD Tx zofran , ns, compazine  Review of Systems: As per HPI otherwise 10 point review of systems negative.   Past Medical History:  Diagnosis Date   Anxiety    Bipolar disorder (HCC)    Chronic back pain    Depression    Gestational diabetes     Past Surgical History:  Procedure Laterality Date   ADENOIDECTOMY     TYMPANOSTOMY     WISDOM TOOTH EXTRACTION       reports that she has been smoking cigarettes. She has never used smokeless tobacco. She reports that she does not currently use alcohol. She reports that she does not currently use drugs after having used the following drugs: Marijuana.  No Known Allergies  Family History  Problem Relation Age of Onset   Diabetes Mother     Prior to Admission medications   Medication Sig Start Date End Date Taking? Authorizing Provider  acetaminophen  (TYLENOL ) 325 MG tablet Take 650 mg by mouth every 6  (six) hours as needed for mild pain.    [provider]  hydrOXYzine  (ATARAX /VISTARIL ) 25 MG tablet Take 1 tablet (25 mg total) by mouth every 6 (six) hours as needed for anxiety. 08/25/20   Ozan, Jennifer, DO  ibuprofen  (ADVIL ) 600 MG tablet Take 1 tablet (600 mg total) by mouth every 6 (six) hours as needed for headache, mild pain, moderate pain or cramping. 08/25/20   Ozan, Jennifer, DO  lamoTRIgine  (LAMICTAL ) 100 MG tablet Take 100 mg by mouth daily. 06/18/20   [provider]  Prenatal Vit-Fe Fumarate-FA (PRENATAL VITAMINS PO) Take 1 tablet by mouth daily.     [provider]    Physical Exam: Vitals:   09/16/24 0030 09/16/24 0100 09/16/24 0158 09/16/24 0610  BP: 119/79 115/79 124/85 112/68  Pulse: 72 83 76 93  Resp: 20 17 18 18   Temp:   100.1 F (37.8 C) 100.3 F (37.9 C)  TempSrc:   Oral Oral  SpO2: 99% 99% 97% 100%  Weight:      Height:        Constitutional: NAD, calm, comfortable Vitals:   09/16/24 0030 09/16/24 0100 09/16/24 0158 09/16/24 0610  BP: 119/79 115/79 124/85 112/68  Pulse: 72 83 76 93  Resp: 20 17 18 18   Temp:   100.1 F (37.8 C) 100.3 F (37.9 C)  TempSrc:  Oral Oral  SpO2: 99% 99% 97% 100%  Weight:      Height:       Eyes: PERRL, lids and conjunctivae normal ENMT: Mucous membranes are moist. Posterior pharynx clear of any exudate or lesions.Normal dentition.  Neck: normal, supple, no masses, no thyromegaly Respiratory: clear to auscultation bilaterally, no wheezing, no crackles. Normal respiratory effort. No accessory muscle use.  Cardiovascular: Regular rate and rhythm, no murmurs / rubs / gallops. No extremity edema. 2+ pedal pulses.  Abdomen: no tenderness, no masses palpated. No hepatosplenomegaly. Bowel sounds positive.  Musculoskeletal: no clubbing / cyanosis. No joint deformity upper and lower extremities. Good ROM, no contractures. Normal muscle tone.  Skin: no rashes, lesions, ulcers. No induration Neurologic: CN 2-12  grossly intact. Sensation intact, Strength 5/5 in all 4.  Psychiatric: Normal judgment and insight. Alert and oriented x 3. Normal mood.    Labs on Admission: I have personally reviewed following labs and imaging studies  CBC: Recent Labs  Lab 09/15/24 1959  WBC 11.4*  HGB 14.2  HCT 41.1  MCV 84.7  PLT 219   Basic Metabolic Panel: Recent Labs  Lab 09/15/24 1959  NA 140  K 4.3  CL 103  CO2 22  GLUCOSE 130*  BUN 10  CREATININE 0.77  CALCIUM 9.8   GFR: Estimated Creatinine Clearance: 94.2 mL/min (by C-G formula based on SCr of 0.77 mg/dL). Liver Function Tests: Recent Labs  Lab 09/15/24 1959  AST 65*  ALT 96*  ALKPHOS 69  BILITOT 0.5  PROT 7.8  ALBUMIN 4.9   Recent Labs  Lab 09/15/24 1959  LIPASE 212*   No results for input(s): AMMONIA in the last 168 hours. Coagulation Profile: No results for input(s): INR, PROTIME in the last 168 hours. Cardiac Enzymes: No results for input(s): CKTOTAL, CKMB, CKMBINDEX, TROPONINI in the last 168 hours. BNP (last 3 results) No results for input(s): PROBNP in the last 8760 hours. HbA1C: No results for input(s): HGBA1C in the last 72 hours. CBG: No results for input(s): GLUCAP in the last 168 hours. Lipid Profile: No results for input(s): CHOL, HDL, LDLCALC, TRIG, CHOLHDL, LDLDIRECT in the last 72 hours. Thyroid Function Tests: No results for input(s): TSH, T4TOTAL, FREET4, T3FREE, THYROIDAB in the last 72 hours. Anemia Panel: No results for input(s): VITAMINB12, FOLATE, FERRITIN, TIBC, IRON, RETICCTPCT in the last 72 hours. Urine analysis:    Component Value Date/Time   COLORURINE YELLOW 07/08/2020 1956   APPEARANCEUR HAZY (A) 07/08/2020 1956   LABSPEC 1.009 07/08/2020 1956   PHURINE 7.0 07/08/2020 1956   GLUCOSEU NEGATIVE 07/08/2020 1956   HGBUR NEGATIVE 07/08/2020 1956   BILIRUBINUR NEGATIVE 07/08/2020 1956   BILIRUBINUR small (A) 12/02/2018 1630    KETONESUR NEGATIVE 07/08/2020 1956   PROTEINUR NEGATIVE 07/08/2020 1956   UROBILINOGEN 1.0 12/02/2018 1630   UROBILINOGEN 0.2 02/17/2018 2008   NITRITE NEGATIVE 07/08/2020 1956   LEUKOCYTESUR NEGATIVE 07/08/2020 1956    Radiological Exams on Admission: DG Chest Portable 1 View Result Date: 09/15/2024 EXAM: 1 VIEW(S) XRAY OF THE CHEST 09/15/2024 10:41:00 PM COMPARISON: None available. CLINICAL HISTORY: Pt reports vomiting and not being able to keep food/fluids down for past 24 hours. Pt reports chills. FINDINGS: LUNGS AND PLEURA: No focal pulmonary opacity. No pulmonary edema. No pleural effusion. No pneumothorax. HEART AND MEDIASTINUM: No acute abnormality of the cardiac and mediastinal silhouettes. BONES AND SOFT TISSUES: No acute osseous abnormality. IMPRESSION: 1. No acute abnormalities. Electronically signed by: Andrea Gasman MD 09/15/2024 10:59 PM EDT RP  Workstation: [id number]   CT ABDOMEN PELVIS W CONTRAST Result Date: 09/15/2024 EXAM: CT ABDOMEN AND PELVIS WITH CONTRAST 09/15/2024 10:34:58 PM TECHNIQUE: CT of the abdomen and pelvis was performed with the administration of of intravenous iohexol (OMNIPAQUE) 300 MG/ML solution. Multiplanar reformatted images are provided for review. Automated exposure control, iterative reconstruction, and/or weight-based adjustment of the mA/kV was utilized to reduce the radiation dose to as low as reasonably achievable. COMPARISON: None available. CLINICAL HISTORY: Pain epigastric area with n/v and elevated lipase. Emesis. Pt reports emesis x 24 hours with chills and mild diarrhea. FINDINGS: LOWER CHEST: No acute abnormality. LIVER: The liver is unremarkable. GALLBLADDER AND BILE DUCTS: Gallbladder is unremarkable. No biliary ductal dilatation. SPLEEN: No acute abnormality. PANCREAS: No acute abnormality. ADRENAL GLANDS: No acute abnormality. KIDNEYS, URETERS AND BLADDER: No stones in the kidneys or ureters. No hydronephrosis. No perinephric or  periureteral stranding. Urinary bladder is unremarkable. GI AND BOWEL: Stomach demonstrates no acute abnormality. There is no bowel obstruction. PERITONEUM AND RETROPERITONEUM: No ascites. No free air. VASCULATURE: Aorta is normal in caliber. LYMPH NODES: No lymphadenopathy. REPRODUCTIVE ORGANS: No acute abnormality. BONES AND SOFT TISSUES: No acute osseous abnormality. No focal soft tissue abnormality. IMPRESSION: 1. No acute findings in the abdomen or pelvis. Electronically signed by: Norman Gatlin MD 09/15/2024 10:56 PM EDT RP Workstation: [id number]    EKG: Independently reviewed.   Assessment/Plan  Acute gastroenteritis nos  - improved s/p treatment  -continue with supportive care  -advance diet as tolerated   Mild Elevation in Lipase  Mild Elevation in AST/ALT  - possible due to ingestion ? ETOH  vs viral etiology vs medication side-ef - RVP negative  -will get full RV - check UDS,ETOH  level  -continue to monitor  -CT Abd no acute abnormality  -hold hepatotoxic medications   Bipolar d/o  Anxiety  -resume home regimen as able  -hold medications that are hepatotoxic    DVT prophylaxis: heparin Code Status: full/ as discussed per patient wishes in event of cardiac arrest  Family Communication: none at bedside Disposition Plan: patient  expected to be admitted greater than 2 midnights  Consults called: n/a Admission status: med tele   Camila DELENA Ned MD [facility]   If 7PM-7AM, please contact night-coverage [email or web address] Password [email or web address]  09/16/2024, 7:11 AM

## 2024-09-16 NOTE — Discharge Summary (Signed)
 Physician Discharge Summary   Patient: Isabel Chang MRN: [id number] DOB: [birth year]  Admit date:     09/15/2024  Discharge date: 09/16/24  Discharge Physician: Delon Herald   PCP: Pcp, No   Recommendations at discharge:    Follow up with PCP, referral made  Discharge Diagnoses: Principal Problem:   Acute gastroenteritis Active Problems:   Transaminitis   Bipolar disorder (HCC)   Marijuana use    Hospital Course: 23yo with h/o bipolar d/o and chronic back pain who presented with n/v.  Concern for gastroenteritis.  Assessment and Plan:  Acute gastroenteritis  Improved with symptomatic treatment  Advanced diet without difficulty Hemodynamically stable with unremarkable labs Will dc to home Zofran  prescribed for as needed use   Transaminitis Suspect viral in nature CT Abd no acute abnormality  Improving   Bipolar d/o  Not on home medications  Marijuana use Cessation should be encouraged on an ongoing basis     Consultants: TOC team  Procedures: None  Antibiotics: None    Pain control - [place]  Controlled Substance Reporting System database was reviewed. and patient was instructed, not to drive, operate heavy machinery, perform activities at heights, swimming or participation in water activities or provide baby-sitting services while on Pain, Sleep and Anxiety Medications; until their outpatient Physician has advised to do so again. Also recommended to not to take more than prescribed Pain, Sleep and Anxiety Medications.   Disposition: Home Diet recommendation:  Regular diet, as tolerated DISCHARGE MEDICATION: Allergies as of 09/16/2024   No Known Allergies      Medication List     TAKE these medications    ibuprofen  200 MG tablet Commonly known as: ADVIL  Take 400-600 mg by mouth every 6 (six) hours as needed for mild pain (pain score 1-3) or moderate pain (pain score 4-6).   NYQUIL PO Take 2 capsules by mouth at bedtime as  needed (cough).   ondansetron  4 MG tablet Commonly known as: ZOFRAN  Take 1 tablet (4 mg total) by mouth every 6 (six) hours as needed for nausea.        Discharge Exam:   Subjective: Feeling fine today.  No further n/v/d.  Ok with going home.   Objective: Vitals:   09/16/24 0610 09/16/24 1052  BP: 112/68 121/75  Pulse: 93 90  Resp: 18 16  Temp: 100.3 F (37.9 C) 98.9 F (37.2 C)  SpO2: 100% 100%    Intake/Output Summary (Last 24 hours) at 09/16/2024 1307 Last data filed at 09/16/2024 1100 Gross per 24 hour  Intake 240 ml  Output --  Net 240 ml   Filed Weights   09/15/24 1949  Weight: 63.5 kg    Exam:  General:  Appears calm and comfortable and is in NAD Eyes:  normal lids, iris ENT:  grossly normal hearing, lips & tongue, mmm Cardiovascular:  RRR, no m/r/g. No LE edema.  Respiratory:   CTA bilaterally with no wheezes/rales/rhonchi.  Normal respiratory effort. Abdomen:  soft, NT, ND Skin:  no rash or induration seen on limited exam Musculoskeletal:  grossly normal tone BUE/BLE, good ROM, no bony abnormality Psychiatric:  grossly normal mood and affect, speech fluent and appropriate, AOx3 Neurologic:  CN 2-12 grossly intact, moves all extremities in coordinated fashion, sensation intact  Data Reviewed: I have reviewed the patient's lab results since admission.  Pertinent labs for today include:   Stable BMP AST 32/ALT 65, improved from 65/96 Normal CBC Monospot negative TSH WNL HIV pending COVID/flu/RSV negative RVP pending  Condition at discharge: stable  The results of significant diagnostics from this hospitalization (including imaging, microbiology, ancillary and laboratory) are listed below for reference.   Imaging Studies: DG Chest Portable 1 View Result Date: 09/15/2024 EXAM: 1 VIEW(S) XRAY OF THE CHEST 09/15/2024 10:41:00 PM COMPARISON: None available. CLINICAL HISTORY: Pt reports vomiting and not being able to keep food/fluids down for  past 24 hours. Pt reports chills. FINDINGS: LUNGS AND PLEURA: No focal pulmonary opacity. No pulmonary edema. No pleural effusion. No pneumothorax. HEART AND MEDIASTINUM: No acute abnormality of the cardiac and mediastinal silhouettes. BONES AND SOFT TISSUES: No acute osseous abnormality. IMPRESSION: 1. No acute abnormalities. Electronically signed by: Andrea Gasman MD 09/15/2024 10:59 PM EDT RP Workstation: [id number]   CT ABDOMEN PELVIS W CONTRAST Result Date: 09/15/2024 EXAM: CT ABDOMEN AND PELVIS WITH CONTRAST 09/15/2024 10:34:58 PM TECHNIQUE: CT of the abdomen and pelvis was performed with the administration of of intravenous iohexol (OMNIPAQUE) 300 MG/ML solution. Multiplanar reformatted images are provided for review. Automated exposure control, iterative reconstruction, and/or weight-based adjustment of the mA/kV was utilized to reduce the radiation dose to as low as reasonably achievable. COMPARISON: None available. CLINICAL HISTORY: Pain epigastric area with n/v and elevated lipase. Emesis. Pt reports emesis x 24 hours with chills and mild diarrhea. FINDINGS: LOWER CHEST: No acute abnormality. LIVER: The liver is unremarkable. GALLBLADDER AND BILE DUCTS: Gallbladder is unremarkable. No biliary ductal dilatation. SPLEEN: No acute abnormality. PANCREAS: No acute abnormality. ADRENAL GLANDS: No acute abnormality. KIDNEYS, URETERS AND BLADDER: No stones in the kidneys or ureters. No hydronephrosis. No perinephric or periureteral stranding. Urinary bladder is unremarkable. GI AND BOWEL: Stomach demonstrates no acute abnormality. There is no bowel obstruction. PERITONEUM AND RETROPERITONEUM: No ascites. No free air. VASCULATURE: Aorta is normal in caliber. LYMPH NODES: No lymphadenopathy. REPRODUCTIVE ORGANS: No acute abnormality. BONES AND SOFT TISSUES: No acute osseous abnormality. No focal soft tissue abnormality. IMPRESSION: 1. No acute findings in the abdomen or pelvis. Electronically signed by:  Norman Gatlin MD 09/15/2024 10:56 PM EDT RP Workstation: [id number]    Microbiology: Results for orders placed or performed during the hospital encounter of 09/15/24  Resp panel by RT-PCR (RSV, Flu A&B, Covid) Anterior Nasal Swab     Status: None   Collection Time: 09/15/24 10:43 PM   Specimen: Anterior Nasal Swab  Result Value Ref Range Status   SARS Coronavirus 2 by RT PCR NEGATIVE NEGATIVE Final    Comment: (NOTE) SARS-CoV-2 target nucleic acids are NOT DETECTED.  The SARS-CoV-2 RNA is generally detectable in upper respiratory specimens during the acute phase of infection. The lowest concentration of SARS-CoV-2 viral copies this assay can detect is 138 copies/mL. A negative result does not preclude SARS-Cov-2 infection and should not be used as the sole basis for treatment or other patient management decisions. A negative result may occur with  improper specimen collection/handling, submission of specimen other than nasopharyngeal swab, presence of viral mutation(s) within the areas targeted by this assay, and inadequate number of viral copies(<138 copies/mL). A negative result must be combined with clinical observations, patient history, and epidemiological information. The expected result is Negative.  Fact Sheet for Patients:  BloggerCourse.com  Fact Sheet for Healthcare Providers:  SeriousBroker.it  This test is no t yet approved or cleared by the [place]  FDA and  has been authorized for detection and/or diagnosis of SARS-CoV-2 by FDA under an Emergency Use Authorization (EUA). This EUA will remain  in effect (meaning this test can be used) for the  duration of the COVID-19 declaration under Section 564(b)(1) of the Act, 21 U.S.C.section 360bbb-3(b)(1), unless the authorization is terminated  or revoked sooner.       Influenza A by PCR NEGATIVE NEGATIVE Final   Influenza B by PCR NEGATIVE NEGATIVE Final     Comment: (NOTE) The Xpert Xpress SARS-CoV-2/FLU/RSV plus assay is intended as an aid in the diagnosis of influenza from Nasopharyngeal swab specimens and should not be used as a sole basis for treatment. Nasal washings and aspirates are unacceptable for Xpert Xpress SARS-CoV-2/FLU/RSV testing.  Fact Sheet for Patients: BloggerCourse.com  Fact Sheet for Healthcare Providers: SeriousBroker.it  This test is not yet approved or cleared by the [place]  FDA and has been authorized for detection and/or diagnosis of SARS-CoV-2 by FDA under an Emergency Use Authorization (EUA). This EUA will remain in effect (meaning this test can be used) for the duration of the COVID-19 declaration under Section 564(b)(1) of the Act, 21 U.S.C. section 360bbb-3(b)(1), unless the authorization is terminated or revoked.     Resp Syncytial Virus by PCR NEGATIVE NEGATIVE Final    Comment: (NOTE) Fact Sheet for Patients: BloggerCourse.com  Fact Sheet for Healthcare Providers: SeriousBroker.it  This test is not yet approved or cleared by the [place]  FDA and has been authorized for detection and/or diagnosis of SARS-CoV-2 by FDA under an Emergency Use Authorization (EUA). This EUA will remain in effect (meaning this test can be used) for the duration of the COVID-19 declaration under Section 564(b)(1) of the Act, 21 U.S.C. section 360bbb-3(b)(1), unless the authorization is terminated or revoked.  Performed at [facility], [address] [place], [place] [address]     Labs: CBC: Recent Labs  Lab 09/15/24 1959 09/16/24 0855  WBC 11.4* 6.8  HGB 14.2 12.0  HCT 41.1 37.9  MCV 84.7 88.1  PLT 219 194   Basic Metabolic Panel: Recent Labs  Lab 09/15/24 1959 09/16/24 0855  NA 140 138  K 4.3 3.5  CL 103 103  CO2 22 21*  GLUCOSE 130* 79  BUN 10 9  CREATININE 0.77 0.69   CALCIUM 9.8 8.8*  MG  --  1.9   Liver Function Tests: Recent Labs  Lab 09/15/24 1959 09/16/24 0855  AST 65* 32  ALT 96* 65*  ALKPHOS 69 58  BILITOT 0.5 0.4  PROT 7.8 6.9  ALBUMIN 4.9 4.4   CBG: No results for input(s): GLUCAP in the last 168 hours.  Discharge time spent: greater than 30 minutes.  Signed: Delon Herald, MD [facility] 09/16/2024

## 2024-09-16 NOTE — Plan of Care (Signed)
